# Patient Record
Sex: Female | Born: 1950 | Race: White | Hispanic: No | Marital: Married | Smoking: Never smoker
Health system: Southern US, Community
[De-identification: ages and names within clinical notes are randomized; demographics above are authoritative.]

## PROBLEM LIST (undated history)

## (undated) DIAGNOSIS — F419 Anxiety disorder, unspecified: Secondary | ICD-10-CM

## (undated) DIAGNOSIS — F32A Depression, unspecified: Secondary | ICD-10-CM

## (undated) DIAGNOSIS — F329 Major depressive disorder, single episode, unspecified: Secondary | ICD-10-CM

## (undated) DIAGNOSIS — J3089 Other allergic rhinitis: Secondary | ICD-10-CM

## (undated) DIAGNOSIS — E119 Type 2 diabetes mellitus without complications: Secondary | ICD-10-CM

## (undated) DIAGNOSIS — N840 Polyp of corpus uteri: Secondary | ICD-10-CM

## (undated) DIAGNOSIS — I499 Cardiac arrhythmia, unspecified: Secondary | ICD-10-CM

## (undated) DIAGNOSIS — E785 Hyperlipidemia, unspecified: Secondary | ICD-10-CM

## (undated) DIAGNOSIS — K219 Gastro-esophageal reflux disease without esophagitis: Secondary | ICD-10-CM

## (undated) DIAGNOSIS — I10 Essential (primary) hypertension: Secondary | ICD-10-CM

## (undated) DIAGNOSIS — E1169 Type 2 diabetes mellitus with other specified complication: Secondary | ICD-10-CM

## (undated) HISTORY — DX: Hyperlipidemia, unspecified: E78.5

## (undated) HISTORY — PX: FOOT SURGERY: SHX648

## (undated) HISTORY — DX: Hyperlipidemia, unspecified: E11.69

## (undated) HISTORY — DX: Type 2 diabetes mellitus without complications: E11.9

---
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
## 2000-03-10 ENCOUNTER — Emergency Department (HOSPITAL_COMMUNITY): Admission: EM | Admit: 2000-03-10 | Discharge: 2000-03-10 | Payer: Self-pay

## 2002-08-09 ENCOUNTER — Encounter: Payer: Self-pay | Admitting: Family Medicine

## 2002-08-09 ENCOUNTER — Ambulatory Visit (HOSPITAL_COMMUNITY): Admission: RE | Admit: 2002-08-09 | Discharge: 2002-08-09 | Payer: Self-pay | Admitting: Family Medicine

## 2004-05-14 ENCOUNTER — Ambulatory Visit (HOSPITAL_COMMUNITY): Admission: RE | Admit: 2004-05-14 | Discharge: 2004-05-14 | Payer: Self-pay | Admitting: Family Medicine

## 2005-08-03 ENCOUNTER — Other Ambulatory Visit: Admission: RE | Admit: 2005-08-03 | Discharge: 2005-08-03 | Payer: Self-pay | Admitting: Family Medicine

## 2005-11-22 ENCOUNTER — Emergency Department (HOSPITAL_COMMUNITY): Admission: EM | Admit: 2005-11-22 | Discharge: 2005-11-22 | Payer: Self-pay | Admitting: Emergency Medicine

## 2006-03-06 ENCOUNTER — Emergency Department (HOSPITAL_COMMUNITY): Admission: EM | Admit: 2006-03-06 | Discharge: 2006-03-06 | Payer: Self-pay | Admitting: *Deleted

## 2006-03-10 ENCOUNTER — Ambulatory Visit (HOSPITAL_COMMUNITY): Admission: RE | Admit: 2006-03-10 | Discharge: 2006-03-10 | Payer: Self-pay | Admitting: Orthopaedic Surgery

## 2007-02-02 ENCOUNTER — Other Ambulatory Visit: Admission: RE | Admit: 2007-02-02 | Discharge: 2007-02-02 | Payer: Self-pay | Admitting: Family Medicine

## 2008-04-09 ENCOUNTER — Other Ambulatory Visit: Admission: RE | Admit: 2008-04-09 | Discharge: 2008-04-09 | Payer: Self-pay | Admitting: Family Medicine

## 2008-04-27 ENCOUNTER — Emergency Department (HOSPITAL_COMMUNITY): Admission: EM | Admit: 2008-04-27 | Discharge: 2008-04-27 | Payer: Self-pay | Admitting: Family Medicine

## 2009-09-12 ENCOUNTER — Emergency Department (HOSPITAL_COMMUNITY): Admission: EM | Admit: 2009-09-12 | Discharge: 2009-09-12 | Payer: Self-pay | Admitting: Emergency Medicine

## 2009-11-11 ENCOUNTER — Emergency Department (HOSPITAL_COMMUNITY): Admission: EM | Admit: 2009-11-11 | Discharge: 2009-11-11 | Payer: Self-pay | Admitting: Family Medicine

## 2009-12-30 ENCOUNTER — Inpatient Hospital Stay (HOSPITAL_COMMUNITY): Admission: AD | Admit: 2009-12-30 | Discharge: 2009-12-30 | Payer: Self-pay | Admitting: Obstetrics & Gynecology

## 2010-01-14 ENCOUNTER — Ambulatory Visit: Payer: Self-pay | Admitting: Obstetrics and Gynecology

## 2010-02-26 ENCOUNTER — Ambulatory Visit: Payer: Self-pay | Admitting: Obstetrics and Gynecology

## 2010-02-26 ENCOUNTER — Other Ambulatory Visit: Admission: RE | Admit: 2010-02-26 | Discharge: 2010-02-26 | Payer: Self-pay | Admitting: Obstetrics and Gynecology

## 2010-02-27 ENCOUNTER — Encounter: Payer: Self-pay | Admitting: Obstetrics and Gynecology

## 2010-02-27 LAB — CONVERTED CEMR LAB
Clue Cells Wet Prep HPF POC: NONE SEEN
Trich, Wet Prep: NONE SEEN
Yeast Wet Prep HPF POC: NONE SEEN

## 2011-02-22 LAB — URINALYSIS, ROUTINE W REFLEX MICROSCOPIC
Glucose, UA: NEGATIVE mg/dL
Hgb urine dipstick: NEGATIVE
Nitrite: NEGATIVE
Protein, ur: NEGATIVE mg/dL
Urobilinogen, UA: 0.2 mg/dL (ref 0.0–1.0)
pH: 5 (ref 5.0–8.0)

## 2011-02-22 LAB — WET PREP, GENITAL
Clue Cells Wet Prep HPF POC: NONE SEEN
Trich, Wet Prep: NONE SEEN
Yeast Wet Prep HPF POC: NONE SEEN

## 2011-02-22 LAB — COMPREHENSIVE METABOLIC PANEL
Alkaline Phosphatase: 93 U/L (ref 39–117)
CO2: 28 mEq/L (ref 19–32)
Total Bilirubin: 0.6 mg/dL (ref 0.3–1.2)
Total Protein: 7.3 g/dL (ref 6.0–8.3)

## 2011-02-22 LAB — URINE MICROSCOPIC-ADD ON

## 2011-02-22 LAB — CBC
Hemoglobin: 13 g/dL (ref 12.0–15.0)
MCV: 84.5 fL (ref 78.0–100.0)

## 2011-03-10 ENCOUNTER — Emergency Department (HOSPITAL_BASED_OUTPATIENT_CLINIC_OR_DEPARTMENT_OTHER)
Admission: EM | Admit: 2011-03-10 | Discharge: 2011-03-10 | Disposition: A | Discharge status: Home / Self Care | Payer: Self-pay | Attending: Emergency Medicine | Admitting: Emergency Medicine

## 2011-03-10 ENCOUNTER — Emergency Department (INDEPENDENT_AMBULATORY_CARE_PROVIDER_SITE_OTHER): Payer: Self-pay

## 2011-03-10 DIAGNOSIS — I10 Essential (primary) hypertension: Secondary | ICD-10-CM | POA: Insufficient documentation

## 2011-03-10 DIAGNOSIS — Z79899 Other long term (current) drug therapy: Secondary | ICD-10-CM | POA: Insufficient documentation

## 2011-03-10 DIAGNOSIS — F0781 Postconcussional syndrome: Secondary | ICD-10-CM | POA: Insufficient documentation

## 2011-03-10 DIAGNOSIS — Y92009 Unspecified place in unspecified non-institutional (private) residence as the place of occurrence of the external cause: Secondary | ICD-10-CM | POA: Insufficient documentation

## 2011-03-10 DIAGNOSIS — R42 Dizziness and giddiness: Secondary | ICD-10-CM

## 2011-03-10 DIAGNOSIS — R110 Nausea: Secondary | ICD-10-CM

## 2011-03-10 DIAGNOSIS — W010XXA Fall on same level from slipping, tripping and stumbling without subsequent striking against object, initial encounter: Secondary | ICD-10-CM

## 2011-03-10 DIAGNOSIS — J45909 Unspecified asthma, uncomplicated: Secondary | ICD-10-CM | POA: Insufficient documentation

## 2011-03-10 DIAGNOSIS — M25569 Pain in unspecified knee: Secondary | ICD-10-CM | POA: Insufficient documentation

## 2011-03-11 LAB — CBC
HCT: 38.7 % (ref 36.0–46.0)
Hemoglobin: 13 g/dL (ref 12.0–15.0)
MCV: 84.9 fL (ref 78.0–100.0)

## 2011-03-11 LAB — BASIC METABOLIC PANEL
BUN: 18 mg/dL (ref 6–23)
Chloride: 106 mEq/L (ref 96–112)
Creatinine, Ser: 0.83 mg/dL (ref 0.4–1.2)
GFR calc Af Amer: >60 mL/min (ref >60)
GFR calc non Af Amer: >60 mL/min (ref >60)
Glucose, Bld: 96 mg/dL (ref 70–99)

## 2011-03-11 LAB — DIFFERENTIAL
Basophils Relative: 1 % (ref 0–1)
Lymphocytes Relative: 19 % (ref 12–46)
Neutro Abs: 5.4 10*3/uL (ref 1.7–7.7)

## 2011-03-11 LAB — URINE MICROSCOPIC-ADD ON

## 2011-03-11 LAB — URINALYSIS, ROUTINE W REFLEX MICROSCOPIC
Bilirubin Urine: NEGATIVE
Glucose, UA: NEGATIVE mg/dL
Specific Gravity, Urine: 1.02 (ref 1.005–1.030)
pH: 6 (ref 5.0–8.0)

## 2011-03-11 LAB — POCT CARDIAC MARKERS
CKMB, poc: <1 ng/mL — ABNORMAL LOW (ref 1.0–8.0)
Troponin i, poc: <0.05 ng/mL (ref 0.00–0.09)

## 2011-04-23 NOTE — Op Note (Signed)
NAME:  Suzanne Patterson, Suzanne Patterson           ACCOUNT NO.:  [id number]   MEDICAL RECORD NO.:  [id number]          PATIENT TYPE:  AMB   LOCATION:  SDS                          FACILITY:  [facility]   PHYSICIAN:  Claude Manges. Whitfield, M.D.DATE OF BIRTH:  1951/03/30   DATE OF PROCEDURE:  03/10/2006  DATE OF DISCHARGE:  03/10/2006                                 OPERATIVE REPORT   PREOPERATIVE DIAGNOSIS:  Closed fracture dislocation of right ankle  involving displaced right distal fibular fracture, posterior tibial plafond  fracture, and anterior ankle dislocation, status post initial reduction of  diastasis and ankle dislocation.   POSTOPERATIVE DIAGNOSIS:  Closed fracture dislocation of right ankle  involving displaced right distal fibular fracture, posterior tibial plafond  fracture, and anterior ankle dislocation, status post initial reduction of  diastasis and ankle dislocation.   PROCEDURE:  Open reduction and internal fixation of right distal fibula,  with fixation of tibial-fibular diastasis.   SURGEON:  Claude Manges. Cleophas Dunker, M.D.   ASSISTANT:  Arlys John D. Petrarca, P.A.-C.   ANESTHESIA:  General orotracheal.   COMPLICATIONS:  None.   HISTORY:  A 60 year old female who fell this past Saturday night, i.e., 4  days ago sustaining an anterior dislocation of the ankle associated with  diastasis of the distal tibia and fibula, a posterior tibia plafond  fracture, and distal fibular fracture associated with a rupture of the  deltoid ligament. Initial treatment in the emergency room involved reduction  of the ankle and diastasis and application of posterior splint.  She was  discharged on medicine and crutches and was followed the office without  problems with the skin or complications.  She is now to have formal fixation  of the distal fibula and fixation of the diastasis.   PROCEDURE:  With the patient comfortable on the operating table and under  general orotracheal anesthesia, a tourniquet was  applied to the right lower  extremity.  On the skin, there was an abrasion along the anteromedial aspect  of the ankle and a blister just above the medial malleolus which was intact.  Otherwise, the skin was intact.  There was no evidence of infection.  The  leg was then prepped with Betadine scrub and then DuraPrep from the tips of  the toes.  Sterile draping was performed.   With  the extremity still elevated, it was Esmarch exsanguinated, with the  proximal tourniquet at 350 mmHg.   A longitudinal incision was made along the distal fibula, perhaps 3 to 3-1/2  inch in length.  Via sharp dissection, the incision was carried down to the  subcutaneous tissue.  Hematoma was debrided. Via blunt dissection, the soft  tissue was then elevated from the fibula. Small veins were left intact.  There was comminution at the fibular fracture. Under direct visualization,  the fracture was reduced and then maintained with a bone clamp. The Acumed  plate and screw system was utilized in addition to the Acumed diastasis  button.   The seven-hole pre-bent titanium plate was then placed over the distal  fibula. It did not quite conform to the shape of the fibula, so it  was sent  bent so that it would.  There were three holes proximal and four distal to  the fracture, with the tip of the plate close to the tip of the fibula. The  plate was maintained in position with a bone clamp.  The two more proximal  screws were drilled, measured, and filled with self-tapping cortical screws.  The two distal screw holes were drilled, measured, and filled with self-  tapping 4-0 cancellus screws.  We checked position at this point, and we had  an excellent position of the plate, and the screws were well within the  fibula distally.  The third most proximal hole was then drilled, measured,  and filled with a self-tapping cortical screw. The the fourth hole was  directly over the fracture site and left alone.  The third  hole proximal to  the very tip was used for the diastasis fixation.  A 3.5 mm drill hole was  then made and then exited out along the medial anterior tibia.  The Acumed  button was threaded through the hole and across the fibula and the tibia  with a blunt needle tip and then placed through the disk. A small stab wound  was made. Soft tissue was elevated off the tibial periosteum, and the button  was then secured to the tibia.  With the foot in dorsiflexion and  maintaining compression between the tibia and fibula, the button was  tightened.  Image intensification revealed reduction of the diastasis.  Several loops were then placed in the suture, and then they were cut.  We  had excellent fixation of the fibula and the diastasis.  The wound was then  copiously irrigated with saline solution.  The soft tissue was closed in  several layers of 0 and 2-0 Vicryl.  The skin closed with a running 3-0  subcuticular Prolene with Steri-Strips over Benzoin.  Sterile bulky dressing  was applied followed by posterior splints.   PLAN:  Percocet for pain; aspirin daily.  Crutches.  Office 1 week.      Claude Manges. Cleophas Dunker, M.D.  Electronically Signed     PWW/MEDQ  D:  03/10/2006  T:  03/11/2006  Job:  [id number]

## 2011-06-24 ENCOUNTER — Encounter: Payer: Self-pay | Admitting: *Deleted

## 2011-06-24 ENCOUNTER — Emergency Department (HOSPITAL_BASED_OUTPATIENT_CLINIC_OR_DEPARTMENT_OTHER)
Admission: EM | Admit: 2011-06-24 | Discharge: 2011-06-25 | Disposition: A | Discharge status: Home / Self Care | Payer: Self-pay | Attending: Emergency Medicine | Admitting: Emergency Medicine

## 2011-06-24 DIAGNOSIS — K219 Gastro-esophageal reflux disease without esophagitis: Secondary | ICD-10-CM | POA: Insufficient documentation

## 2011-06-24 DIAGNOSIS — E860 Dehydration: Secondary | ICD-10-CM | POA: Insufficient documentation

## 2011-06-24 DIAGNOSIS — K5289 Other specified noninfective gastroenteritis and colitis: Secondary | ICD-10-CM | POA: Insufficient documentation

## 2011-06-24 DIAGNOSIS — R112 Nausea with vomiting, unspecified: Secondary | ICD-10-CM | POA: Insufficient documentation

## 2011-06-24 DIAGNOSIS — R197 Diarrhea, unspecified: Secondary | ICD-10-CM | POA: Insufficient documentation

## 2011-06-24 DIAGNOSIS — I10 Essential (primary) hypertension: Secondary | ICD-10-CM | POA: Insufficient documentation

## 2011-06-24 HISTORY — DX: Cardiac arrhythmia, unspecified: I49.9

## 2011-06-24 HISTORY — DX: Gastro-esophageal reflux disease without esophagitis: K21.9

## 2011-06-24 HISTORY — DX: Essential (primary) hypertension: I10

## 2011-06-24 NOTE — ED Notes (Signed)
Pt presents to ED today with c/o flu like sx since this am.  Pt reports seveal members of household have also been sick.  Pt called PMD and was told to come to ER

## 2011-06-25 LAB — COMPREHENSIVE METABOLIC PANEL WITH GFR
ALT: 21 U/L (ref 0–35)
AST: 22 U/L (ref 0–37)
Albumin: 4 g/dL (ref 3.5–5.2)
Alkaline Phosphatase: 111 U/L (ref 39–117)
BUN: 21 mg/dL (ref 6–23)
CO2: 25 meq/L (ref 19–32)
Calcium: 9.1 mg/dL (ref 8.4–10.5)
Chloride: 100 meq/L (ref 96–112)
Creatinine, Ser: 0.7 mg/dL (ref 0.50–1.10)
GFR calc Af Amer: >60 mL/min
GFR calc non Af Amer: >60 mL/min
Glucose, Bld: 138 mg/dL — ABNORMAL HIGH (ref 70–99)
Potassium: 3.8 meq/L (ref 3.5–5.1)
Sodium: 139 meq/L (ref 135–145)
Total Bilirubin: 0.3 mg/dL (ref 0.3–1.2)
Total Protein: 7.9 g/dL (ref 6.0–8.3)

## 2011-06-25 LAB — CBC
Hemoglobin: 13.8 g/dL (ref 12.0–15.0)
MCH: 28 pg (ref 26.0–34.0)
MCV: 83.5 fL (ref 78.0–100.0)
Platelets: 290 10*3/uL (ref 150–400)
RBC: 4.92 MIL/uL (ref 3.87–5.11)
WBC: 10.8 10*3/uL — ABNORMAL HIGH (ref 4.0–10.5)

## 2011-06-25 LAB — URINALYSIS, ROUTINE W REFLEX MICROSCOPIC
Bilirubin Urine: NEGATIVE
Glucose, UA: NEGATIVE mg/dL
Hgb urine dipstick: NEGATIVE
Ketones, ur: NEGATIVE mg/dL
Leukocytes, UA: NEGATIVE
Nitrite: NEGATIVE
Protein, ur: NEGATIVE mg/dL
Specific Gravity, Urine: 1.017 (ref 1.005–1.030)
Urobilinogen, UA: 0.2 mg/dL (ref 0.0–1.0)
pH: 6.5 (ref 5.0–8.0)

## 2011-06-25 MED ORDER — SODIUM CHLORIDE 0.9 % IV SOLN
Freq: Once | INTRAVENOUS | Status: AC
  Administered 2011-06-25: 01:00:00 via INTRAVENOUS

## 2011-06-25 MED ORDER — LOPERAMIDE HCL 2 MG PO CAPS: 2.0000 mg | ORAL_CAPSULE | Freq: Four times a day (QID) | ORAL | Status: AC | PRN

## 2011-06-25 MED ORDER — ONDANSETRON HCL 4 MG PO TABS: 4.0000 mg | ORAL_TABLET | Freq: Four times a day (QID) | ORAL | Status: AC

## 2011-06-25 MED ORDER — SODIUM CHLORIDE 0.9 % IV SOLN
8.0000 mg | Freq: Once | INTRAVENOUS | Status: AC
  Administered 2011-06-25: 8 mg via INTRAVENOUS
  Filled 2011-06-25: qty 4

## 2011-06-25 MED ORDER — SODIUM CHLORIDE 0.9 % IV SOLN
Freq: Once | INTRAVENOUS | Status: AC
  Administered 2011-06-25: via INTRAVENOUS

## 2011-06-25 NOTE — ED Notes (Signed)
Pt presents to ED today with flu like sx since this am.  Pt states unable to tolerate fluids and was advised by PMD to come to ER

## 2011-06-25 NOTE — ED Notes (Signed)
Pt given ice chips per request

## 2011-06-25 NOTE — ED Provider Notes (Signed)
History     Chief Complaint  Patient presents with  . Nausea  . Emesis  . Diarrhea   HPI Comments: Pt has had several family members with the same illness recently.  Patient is a 60 y.o. female presenting with vomiting and diarrhea. The history is provided by the patient.  Emesis  The current episode started 12 to 24 hours ago. The problem occurs more than 10 times per day. The problem has been gradually worsening. Associated symptoms include diarrhea. Pertinent negatives include no abdominal pain.  Diarrhea The primary symptoms include vomiting and diarrhea. Primary symptoms do not include abdominal pain, melena, hematochezia or dysuria.  The diarrhea began today. The diarrhea is watery. The diarrhea occurs more than 10 times per day.  The illness does not include anorexia. Associated medical issues do not include inflammatory bowel disease or bowel resection.    Past Medical History  Diagnosis Date  . Hypertension   . Dysrhythmia   . GERD (gastroesophageal reflux disease)     History reviewed. No pertinent past surgical history.  No family history on file.  History  Substance Use Topics  . Smoking status: Never Smoker   . Smokeless tobacco: Never Used  . Alcohol Use: No    OB History    Grav Para Term Preterm Abortions TAB SAB Ect Mult Living                  Review of Systems  Constitutional: Positive for appetite change.  Respiratory: Negative for chest tightness.   Gastrointestinal: Positive for vomiting and diarrhea. Negative for abdominal pain, blood in stool, melena, hematochezia and anorexia.  Genitourinary: Negative for dysuria.  All other systems reviewed and are negative.    Physical Exam  BP 134/76  Pulse 88  Temp(Src) 98.5 F (36.9 C) (Oral)  Resp 20  SpO2 98%  Physical Exam  Constitutional: She appears well-developed and well-nourished. No distress.  HENT:  Head: Normocephalic and atraumatic.  Right Ear: External ear normal.  Left Ear:  External ear normal.  Mouth/Throat: No oropharyngeal exudate.       Mucous membranes are dry  Eyes: Conjunctivae are normal. Right eye exhibits no discharge. Left eye exhibits no discharge. No scleral icterus.  Neck: Neck supple. No tracheal deviation present.  Cardiovascular: Normal rate, regular rhythm and intact distal pulses.   Pulmonary/Chest: Effort normal and breath sounds normal. No stridor. No respiratory distress. She has no wheezes. She has no rales.  Abdominal: Soft. Bowel sounds are normal. She exhibits no distension and no mass. There is no tenderness. There is no rebound and no guarding.  Musculoskeletal: She exhibits no edema and no tenderness.  Neurological: She is alert. She has normal strength. No sensory deficit. Cranial nerve deficit:  no gross defecits noted. She exhibits normal muscle tone. She displays no seizure activity. Coordination normal.  Skin: Skin is warm and dry. No rash noted.  Psychiatric: She has a normal mood and affect.    ED Course  Procedures Labs Reviewed  CBC - Abnormal; Notable for the following:    WBC 10.8 (*)    All other components within normal limits  COMPREHENSIVE METABOLIC PANEL - Abnormal; Notable for the following:    Glucose, Bld 138 (*)    All other components within normal limits  URINALYSIS, ROUTINE W REFLEX MICROSCOPIC    MDM Labs and x-rays have been reviewed. Patient was given IV fluids and anti-emetics. She is feeling much better at this time. Patient would like  to go home but will first try some ice chips oral to make sure she can tolerate that. Suspect this is viral gastroenteritis. She's had ill contacts in the family     Celene Kras, MD 06/25/11 [phone]

## 2011-06-25 NOTE — ED Notes (Signed)
Family at bedside. 

## 2011-10-12 ENCOUNTER — Other Ambulatory Visit (HOSPITAL_COMMUNITY)
Admission: RE | Admit: 2011-10-12 | Discharge: 2011-10-12 | Disposition: A | Discharge status: Home / Self Care | Payer: Self-pay | Source: Ambulatory Visit | Attending: Family Medicine | Admitting: Family Medicine

## 2011-10-12 ENCOUNTER — Other Ambulatory Visit: Payer: Self-pay | Admitting: Family Medicine

## 2011-10-12 DIAGNOSIS — Z0000 Encounter for general adult medical examination without abnormal findings: Secondary | ICD-10-CM | POA: Insufficient documentation

## 2015-10-28 DIAGNOSIS — E785 Hyperlipidemia, unspecified: Secondary | ICD-10-CM | POA: Diagnosis not present

## 2015-10-28 DIAGNOSIS — Z13820 Encounter for screening for osteoporosis: Secondary | ICD-10-CM | POA: Diagnosis not present

## 2015-10-28 DIAGNOSIS — N949 Unspecified condition associated with female genital organs and menstrual cycle: Secondary | ICD-10-CM | POA: Diagnosis not present

## 2015-10-28 DIAGNOSIS — I10 Essential (primary) hypertension: Secondary | ICD-10-CM | POA: Diagnosis not present

## 2015-10-28 DIAGNOSIS — F419 Anxiety disorder, unspecified: Secondary | ICD-10-CM | POA: Diagnosis not present

## 2015-10-28 DIAGNOSIS — Z1211 Encounter for screening for malignant neoplasm of colon: Secondary | ICD-10-CM | POA: Diagnosis not present

## 2015-10-28 DIAGNOSIS — Z23 Encounter for immunization: Secondary | ICD-10-CM | POA: Diagnosis not present

## 2015-10-28 DIAGNOSIS — Z0000 Encounter for general adult medical examination without abnormal findings: Secondary | ICD-10-CM | POA: Diagnosis not present

## 2015-10-28 DIAGNOSIS — Z1389 Encounter for screening for other disorder: Secondary | ICD-10-CM | POA: Diagnosis not present

## 2015-10-28 DIAGNOSIS — J45909 Unspecified asthma, uncomplicated: Secondary | ICD-10-CM | POA: Diagnosis not present

## 2015-10-28 DIAGNOSIS — E7800 Pure hypercholesterolemia, unspecified: Secondary | ICD-10-CM | POA: Diagnosis not present

## 2016-01-15 DIAGNOSIS — R928 Other abnormal and inconclusive findings on diagnostic imaging of breast: Secondary | ICD-10-CM | POA: Diagnosis not present

## 2016-03-24 DIAGNOSIS — Z85828 Personal history of other malignant neoplasm of skin: Secondary | ICD-10-CM | POA: Diagnosis not present

## 2016-03-24 DIAGNOSIS — X32XXXA Exposure to sunlight, initial encounter: Secondary | ICD-10-CM | POA: Diagnosis not present

## 2016-03-24 DIAGNOSIS — L570 Actinic keratosis: Secondary | ICD-10-CM | POA: Diagnosis not present

## 2016-03-24 DIAGNOSIS — Z08 Encounter for follow-up examination after completed treatment for malignant neoplasm: Secondary | ICD-10-CM | POA: Diagnosis not present

## 2016-04-23 DIAGNOSIS — M25571 Pain in right ankle and joints of right foot: Secondary | ICD-10-CM | POA: Diagnosis not present

## 2016-04-23 DIAGNOSIS — M19071 Primary osteoarthritis, right ankle and foot: Secondary | ICD-10-CM | POA: Diagnosis not present

## 2016-04-26 DIAGNOSIS — I10 Essential (primary) hypertension: Secondary | ICD-10-CM | POA: Diagnosis not present

## 2016-04-26 DIAGNOSIS — R7309 Other abnormal glucose: Secondary | ICD-10-CM | POA: Diagnosis not present

## 2016-07-25 DIAGNOSIS — N760 Acute vaginitis: Secondary | ICD-10-CM | POA: Diagnosis not present

## 2016-07-25 DIAGNOSIS — N898 Other specified noninflammatory disorders of vagina: Secondary | ICD-10-CM | POA: Diagnosis not present

## 2016-08-07 DIAGNOSIS — J069 Acute upper respiratory infection, unspecified: Secondary | ICD-10-CM | POA: Diagnosis not present

## 2016-08-30 DIAGNOSIS — Z23 Encounter for immunization: Secondary | ICD-10-CM | POA: Diagnosis not present

## 2016-08-30 DIAGNOSIS — F419 Anxiety disorder, unspecified: Secondary | ICD-10-CM | POA: Diagnosis not present

## 2016-08-30 DIAGNOSIS — I10 Essential (primary) hypertension: Secondary | ICD-10-CM | POA: Diagnosis not present

## 2016-08-30 DIAGNOSIS — R7301 Impaired fasting glucose: Secondary | ICD-10-CM | POA: Diagnosis not present

## 2016-10-13 DIAGNOSIS — N898 Other specified noninflammatory disorders of vagina: Secondary | ICD-10-CM | POA: Diagnosis not present

## 2016-11-02 DIAGNOSIS — I10 Essential (primary) hypertension: Secondary | ICD-10-CM | POA: Diagnosis not present

## 2016-11-02 DIAGNOSIS — Z23 Encounter for immunization: Secondary | ICD-10-CM | POA: Diagnosis not present

## 2016-11-02 DIAGNOSIS — E119 Type 2 diabetes mellitus without complications: Secondary | ICD-10-CM | POA: Diagnosis not present

## 2016-11-02 DIAGNOSIS — Z13820 Encounter for screening for osteoporosis: Secondary | ICD-10-CM | POA: Diagnosis not present

## 2016-11-02 DIAGNOSIS — Z0000 Encounter for general adult medical examination without abnormal findings: Secondary | ICD-10-CM | POA: Diagnosis not present

## 2016-11-02 DIAGNOSIS — Z1389 Encounter for screening for other disorder: Secondary | ICD-10-CM | POA: Diagnosis not present

## 2016-11-02 DIAGNOSIS — Z1211 Encounter for screening for malignant neoplasm of colon: Secondary | ICD-10-CM | POA: Diagnosis not present

## 2016-12-25 ENCOUNTER — Emergency Department (HOSPITAL_BASED_OUTPATIENT_CLINIC_OR_DEPARTMENT_OTHER): Payer: Medicare HMO

## 2016-12-25 ENCOUNTER — Encounter (HOSPITAL_BASED_OUTPATIENT_CLINIC_OR_DEPARTMENT_OTHER): Payer: Self-pay | Admitting: Emergency Medicine

## 2016-12-25 ENCOUNTER — Emergency Department (HOSPITAL_BASED_OUTPATIENT_CLINIC_OR_DEPARTMENT_OTHER)
Admission: EM | Admit: 2016-12-25 | Discharge: 2016-12-25 | Disposition: A | Discharge status: Home / Self Care | Payer: Medicare HMO | Attending: Emergency Medicine | Admitting: Emergency Medicine

## 2016-12-25 DIAGNOSIS — Z7982 Long term (current) use of aspirin: Secondary | ICD-10-CM | POA: Insufficient documentation

## 2016-12-25 DIAGNOSIS — Z79899 Other long term (current) drug therapy: Secondary | ICD-10-CM | POA: Diagnosis not present

## 2016-12-25 DIAGNOSIS — R072 Precordial pain: Secondary | ICD-10-CM | POA: Diagnosis not present

## 2016-12-25 DIAGNOSIS — M549 Dorsalgia, unspecified: Secondary | ICD-10-CM | POA: Insufficient documentation

## 2016-12-25 DIAGNOSIS — R0602 Shortness of breath: Secondary | ICD-10-CM | POA: Insufficient documentation

## 2016-12-25 DIAGNOSIS — I10 Essential (primary) hypertension: Secondary | ICD-10-CM | POA: Diagnosis not present

## 2016-12-25 DIAGNOSIS — R0789 Other chest pain: Secondary | ICD-10-CM | POA: Diagnosis not present

## 2016-12-25 DIAGNOSIS — R079 Chest pain, unspecified: Secondary | ICD-10-CM

## 2016-12-25 LAB — URINALYSIS, ROUTINE W REFLEX MICROSCOPIC
Bilirubin Urine: NEGATIVE
GLUCOSE, UA: NEGATIVE mg/dL
HGB URINE DIPSTICK: NEGATIVE
Ketones, ur: NEGATIVE mg/dL
Leukocytes, UA: NEGATIVE
Nitrite: NEGATIVE
Protein, ur: NEGATIVE mg/dL
Specific Gravity, Urine: 1.015 (ref 1.005–1.030)
pH: 7.5 (ref 5.0–8.0)

## 2016-12-25 LAB — COMPREHENSIVE METABOLIC PANEL
ALT: 27 U/L (ref 14–54)
AST: 26 U/L (ref 15–41)
Albumin: 4.2 g/dL (ref 3.5–5.0)
Alkaline Phosphatase: 82 U/L (ref 38–126)
Anion gap: 6 (ref 5–15)
BILIRUBIN TOTAL: 0.4 mg/dL (ref 0.3–1.2)
BUN: 19 mg/dL (ref 6–20)
CHLORIDE: 105 mmol/L (ref 101–111)
CO2: 28 mmol/L (ref 22–32)
CREATININE: 0.76 mg/dL (ref 0.44–1.00)
Calcium: 8.7 mg/dL — ABNORMAL LOW (ref 8.9–10.3)
GFR calc Af Amer: >60 mL/min (ref >60)
GFR calc non Af Amer: >60 mL/min (ref >60)
GLUCOSE: 142 mg/dL — AB (ref 65–99)
Potassium: 3.6 mmol/L (ref 3.5–5.1)
Sodium: 139 mmol/L (ref 135–145)
Total Protein: 7.7 g/dL (ref 6.5–8.1)

## 2016-12-25 LAB — TROPONIN I
Troponin I: <0.03 ng/mL (ref <0.03)
Troponin I: <0.03 ng/mL (ref <0.03)

## 2016-12-25 LAB — CBC
HEMATOCRIT: 39.9 % (ref 36.0–46.0)
Hemoglobin: 12.5 g/dL (ref 12.0–15.0)
MCH: 27.8 pg (ref 26.0–34.0)
MCHC: 31.3 g/dL (ref 30.0–36.0)
MCV: 88.7 fL (ref 78.0–100.0)
PLATELETS: 305 10*3/uL (ref 150–400)
RBC: 4.5 MIL/uL (ref 3.87–5.11)
RDW: 14.7 % (ref 11.5–15.5)
WBC: 7.9 10*3/uL (ref 4.0–10.5)

## 2016-12-25 LAB — D-DIMER, QUANTITATIVE: D-Dimer, Quant: <0.27 ug/mL-FEU (ref 0.00–0.50)

## 2016-12-25 MED ORDER — ASPIRIN 81 MG PO CHEW
243.0000 mg | CHEWABLE_TABLET | Freq: Once | ORAL | Status: AC
  Administered 2016-12-25: 243 mg via ORAL
  Filled 2016-12-25: qty 3

## 2016-12-25 NOTE — ED Triage Notes (Signed)
Pt states she was having breakfast and starte to feel a sharp pain in between her shoulder blades and the pain then radiated through to her sternal chest.  Pt reports some mild SOB and dizzy also.

## 2016-12-25 NOTE — Discharge Instructions (Signed)
All of your blood work and imaging has been normal in the ED. Please follow up with your primary care doctor. May continue to take tylenol and ibuprofen for pain. Return to the ED for chest pain becomes exertional, he developed worsening shortness of breath, fevers or for any other reason.

## 2016-12-25 NOTE — ED Provider Notes (Signed)
[facility] Provider Note   CSN: [id number] Arrival date & time: 12/25/16  [id number]     History   Chief Complaint Chief Complaint  Patient presents with  . Chest Pain    HPI Suzanne Patterson is a 66 y.o. female.  66 year old Caucasian female past medical history significant for hypertension and GERD presents to the ED today with complaint of chest pain. Patient states that approximately 9 AM this morning she developed pain in her back between her shoulder blades that radiated to her chest. Patient describes the pain as a sharp ache that has now decreased to a dull ache. Patient states that the pain has been constant but has decreased. She describes a substernal chest pain. The pain was not exertional. It was not pleuritic in nature. Patient does report mild shortness of breath and nausea with the episode. She denies any diaphoresis. The pain did not radiate. She took 2 baby aspirin at home that decreased her pain. Patient also reports mild dizziness with episode that has since resolved. Patient also states that she ate breakfast and had pain with swallowing her food. Patient states that she does have acid reflux and endorses reflux yesterday after eating lasagna. Denies any reflux at this time. Patient does not have any cardiac history. She does have a history of hypertension. Well-controlled on blood pressure medications. Patient denies any hormone therapy, recent hospitalizations or surgeries, prolonged immobilizations, history of DVT, tobacco use. Patient denies any shortness breath at this time. Endorses only mild backache. She denies any fever, chills, headache, numbness,  Patient does state that this can likely be due to anxiety. She has lot of stressors going on in her life currently.      Past Medical History:  Diagnosis Date  . Dysrhythmia   . GERD (gastroesophageal reflux disease)   . Hypertension     There are no active problems to display for this  patient.   Past Surgical History:  Procedure Laterality Date  . FOOT SURGERY Right     OB History    No data available       Home Medications    Prior to Admission medications   Medication Sig Start Date End Date Taking? Authorizing Provider  amLODipine-benazepril (LOTREL) 10-20 MG per capsule Take 1 capsule by mouth daily.     Yes Historical Provider, MD  aspirin 81 MG chewable tablet Chew 81 mg by mouth daily.   Yes Historical Provider, MD  valsartan (DIOVAN) 320 MG tablet Take 320 mg by mouth daily.   Yes Historical Provider, MD  benazepril (LOTENSIN) 20 MG tablet Take 20 mg by mouth daily.      Historical Provider, MD  cetirizine (ZYRTEC) 10 MG tablet Take 10 mg by mouth daily.      Historical Provider, MD  esomeprazole (NEXIUM) 40 MG capsule Take 40 mg by mouth daily before breakfast.      Historical Provider, MD  metoprolol (TOPROL-XL) 50 MG 24 hr tablet Take 50 mg by mouth daily.      Historical Provider, MD    Family History No family history on file.  Social History Social History  Substance Use Topics  . Smoking status: Never Smoker  . Smokeless tobacco: Never Used  . Alcohol use No     Allergies   Codeine and Hydrocodone   Review of Systems Review of Systems  Constitutional: Negative for chills and fever.  HENT: Negative for congestion.   Eyes: Negative for visual disturbance.  Respiratory: Positive  for shortness of breath. Negative for cough and wheezing.   Cardiovascular: Positive for chest pain. Negative for palpitations and leg swelling.  Gastrointestinal: Negative for abdominal pain, diarrhea, nausea and vomiting.  Genitourinary: Negative for dysuria, frequency, hematuria and urgency.  Musculoskeletal: Positive for back pain. Negative for neck pain and neck stiffness.  Skin: Negative.   Neurological: Negative for dizziness, syncope, weakness, light-headedness, numbness and headaches.  All other systems reviewed and are negative.    Physical  Exam Updated Vital Signs BP 134/78   Pulse 73   Temp 98.5 F (36.9 C) (Oral)   Resp 17   Ht 5\' 5"  (1.651 m)   Wt 113.4 kg   SpO2 98%   BMI 41.60 kg/m   Physical Exam  Constitutional: She appears well-developed and well-nourished. No distress.  Patient is laughing and joking in the room and appears to be in no acute distress.  HENT:  Head: Normocephalic and atraumatic.  Mouth/Throat: Oropharynx is clear and moist.  Eyes: Conjunctivae and EOM are normal. Pupils are equal, round, and reactive to light. Right eye exhibits no discharge. Left eye exhibits no discharge. No scleral icterus.  Neck: Normal range of motion. Neck supple. No thyromegaly present.  Cardiovascular: Normal rate, regular rhythm, normal heart sounds and intact distal pulses.  Exam reveals no gallop and no friction rub.   No murmur heard. Pulmonary/Chest: Effort normal and breath sounds normal. No respiratory distress. She exhibits no tenderness.  Abdominal: Soft. Bowel sounds are normal. She exhibits no distension. There is no tenderness. There is no rebound and no guarding.  Musculoskeletal: Normal range of motion.  Patient does have tenderness with palpation between the shoulder blades. No deformity or crepitus noted. Full range of motion. Denies injury.  Lymphadenopathy:    She has no cervical adenopathy.  Neurological: She is alert.  Skin: Skin is warm and dry. Capillary refill takes less than 2 seconds.  Nursing note and vitals reviewed.    ED Treatments / Results  Labs (all labs ordered are listed, but only abnormal results are displayed) Labs Reviewed  COMPREHENSIVE METABOLIC PANEL - Abnormal; Notable for the following:       Result Value   Glucose, Bld 142 (*)    Calcium 8.7 (*)    All other components within normal limits  URINALYSIS, ROUTINE W REFLEX MICROSCOPIC - Abnormal; Notable for the following:    APPearance CLOUDY (*)    All other components within normal limits  CBC  TROPONIN I   D-DIMER, QUANTITATIVE (NOT AT [facility])  TROPONIN I    EKG  EKG Interpretation  Date/Time:  Saturday December 25 2016 09:50:22 EST Ventricular Rate:  90 PR Interval:    QRS Duration: 100 QT Interval:  370 QTC Calculation: 453 R Axis:   47 Text Interpretation:  Sinus rhythm Anteroseptal infarct, age indeterminate No significant change since last tracing Confirmed by [facility]  MD, MARTHA [phone]) on 12/25/2016 11:04:59 AM       Radiology Dg Chest 2 View  Result Date: 12/25/2016 CLINICAL DATA:  Chest pain. EXAM: CHEST  2 VIEW COMPARISON:  11/25/2012 FINDINGS: The heart size is normal. The aorta shows stable ectasia. Atelectasis present at the right lung base. There is no evidence of pulmonary edema, consolidation, pneumothorax, nodule or pleural fluid. The visualized skeletal structures are unremarkable. IMPRESSION: Right basilar atelectasis.  No acute findings. Electronically Signed   By: Aletta Edouard M.D.   On: 12/25/2016 10:22    Procedures Procedures (including critical care time)  Medications Ordered in ED Medications  aspirin chewable tablet 243 mg (243 mg Oral Given 12/25/16 1036)     Initial Impression / Assessment and Plan / ED Course  I have reviewed the triage vital signs and the nursing notes.  Pertinent labs & imaging results that were available during my care of the patient were reviewed by me and considered in my medical decision making (see chart for details).    Patient is to be discharged with recommendation to follow up with PCP in regards to today's hospital visit. Heart pathway score is 3 due to age and risk factors. EKG without any acute changes. Delta troponins were obtained. Chest pain is not likely of cardiac or pulmonary etiology d/t presentation, d dimer negative, VSS, no tracheal deviation, no JVD or new murmur, RRR, breath sounds equal bilaterally, EKG without acute abnormalities, negative troponin, and negative CXR. Patient states that her pain has  resolved since arriving to the ED. Patient is currently pain-free in the room. Pt has been advised return to the ED is CP becomes exertional, associated with diaphoresis or nausea, radiates to left jaw/arm, worsens or becomes concerning in any way. Pt appears reliable for follow up and is agreeable to discharge.   Case has been discussed with and seen by Dr. Canary Brim who agrees with the above plan to discharge.    Final Clinical Impressions(s) / ED Diagnoses   Final diagnoses:  Chest pain, unspecified type    New Prescriptions Discharge Medication List as of 12/25/2016  2:29 PM       Doristine Devoid, PA-C 12/26/16 [place], MD 12/26/16 1544

## 2017-01-21 DIAGNOSIS — J101 Influenza due to other identified influenza virus with other respiratory manifestations: Secondary | ICD-10-CM | POA: Diagnosis not present

## 2017-01-25 DIAGNOSIS — J209 Acute bronchitis, unspecified: Secondary | ICD-10-CM | POA: Diagnosis not present

## 2017-04-14 DIAGNOSIS — Z780 Asymptomatic menopausal state: Secondary | ICD-10-CM | POA: Diagnosis not present

## 2017-04-14 DIAGNOSIS — Z13820 Encounter for screening for osteoporosis: Secondary | ICD-10-CM | POA: Diagnosis not present

## 2017-04-20 ENCOUNTER — Ambulatory Visit (INDEPENDENT_AMBULATORY_CARE_PROVIDER_SITE_OTHER): Payer: Self-pay

## 2017-04-20 ENCOUNTER — Ambulatory Visit (INDEPENDENT_AMBULATORY_CARE_PROVIDER_SITE_OTHER): Payer: Medicare HMO

## 2017-04-20 ENCOUNTER — Encounter (INDEPENDENT_AMBULATORY_CARE_PROVIDER_SITE_OTHER): Payer: Self-pay | Admitting: Orthopedic Surgery

## 2017-04-20 ENCOUNTER — Ambulatory Visit (INDEPENDENT_AMBULATORY_CARE_PROVIDER_SITE_OTHER): Payer: Medicare HMO | Admitting: Orthopedic Surgery

## 2017-04-20 ENCOUNTER — Encounter (INDEPENDENT_AMBULATORY_CARE_PROVIDER_SITE_OTHER): Payer: Self-pay

## 2017-04-20 VITALS — BP 171/98 | HR 91 | Resp 18 | Wt 258.0 lb

## 2017-04-20 DIAGNOSIS — M79675 Pain in left toe(s): Secondary | ICD-10-CM | POA: Diagnosis not present

## 2017-04-20 DIAGNOSIS — M79676 Pain in unspecified toe(s): Secondary | ICD-10-CM | POA: Diagnosis not present

## 2017-04-20 DIAGNOSIS — S92425A Nondisplaced fracture of distal phalanx of left great toe, initial encounter for closed fracture: Secondary | ICD-10-CM | POA: Diagnosis not present

## 2017-04-20 DIAGNOSIS — S92502A Displaced unspecified fracture of left lesser toe(s), initial encounter for closed fracture: Secondary | ICD-10-CM

## 2017-04-20 MED ORDER — TRAMADOL HCL 50 MG PO TABS: 50.0000 mg | ORAL_TABLET | Freq: Four times a day (QID) | ORAL | 0 refills | Status: DC | PRN

## 2017-04-20 NOTE — Progress Notes (Signed)
Office Visit Note   Patient: Suzanne Patterson           Date of Birth: 28-Jul-1951           MRN: [id number] Visit Date: 04/20/2017              Requested by: [name], [address], [place] [id number] PCP: [name], MD   Assessment & Plan: Visit Diagnoses:  1. Closed fracture of phalanx of left fifth toe, initial encounter   2. Pain of fifth toe     Plan:  #1: Closed reduction of the proximal phalanx of the fifth toe left foot was performed with marked improvement in the position. Buddy taped with a bolster in the webspace  Follow-Up Instructions: Return in about 5 days (around 04/25/2017).   Orders:  Orders Placed This Encounter  Procedures  . XR Toe 5th Left  . XR Toe 5th Left   Meds ordered this encounter  Medications  . traMADol (ULTRAM) 50 MG tablet    Sig: Take 1 tablet (50 mg total) by mouth every 6 (six) hours as needed.    Dispense:  15 tablet    Refill:  0    Order Specific Question:   Supervising Provider    Answer:   Garald Balding [[id number]]      Procedures: No procedures performed   Clinical Data: No additional findings.   Subjective: Chief Complaint  Patient presents with  . Toe Injury    left 5th toe pain     Probably is a very pleasant 66 year old white female who is seen today for evaluation of her left little toe. Apparently she was walking today and caught the toe on a door jam. This caused immediate excruciating pain now to the little toe. She called for an appointment she comes in today for evaluation. She now denies any numbness or tingling. Pain is mainly in the proximal portion of the fifth toe. No previous history of injury or traumas noted.    Review of Systems  Constitutional: Negative.   HENT: Negative.   Respiratory: Negative.   Cardiovascular: Negative.   Gastrointestinal: Negative.   Genitourinary: Negative.   Skin: Negative.   Neurological: Negative.   Hematological: Negative.     Psychiatric/Behavioral: Negative.      Objective: Vital Signs: BP (!) 171/98   Pulse 91   Resp 18   Wt 258 lb (117 kg)   BMI 42.93 kg/m   Physical Exam  Constitutional: She is oriented to person, place, and time. She appears well-developed and well-nourished.  HENT:  Head: Normocephalic and atraumatic.  Eyes: EOM are normal. Pupils are equal, round, and reactive to light.  Pulmonary/Chest: Effort normal.  Neurological: She is alert and oriented to person, place, and time.  Skin: Skin is warm and dry.  Psychiatric: She has a normal mood and affect. Her behavior is normal. Judgment and thought content normal.    Ortho Exam  The fifth toe is examined today noted to have abduction of the little toe comparison to the fourth. There is widening of the webspace.  Palpation at the proximal portion of the little toe. Capillary refill. Sensation is intact to light touch.  Specialty Comments:  No specialty comments available.  Imaging: Xr Toe 5th Left  Result Date: 04/20/2017 Post reduction film of the left little toe proximal phalanx fracture reveals the much more acceptable position of the fracture up. Again I believe this is a 3 part  fracture up.  Xr Toe 5th Left  Result Date: 04/20/2017 Three-view x-ray of the left foot reveals an angulated proximal phalanx fracture of the little toe. There may be a three-part fracture.    PMFS History: There are no active problems to display for this patient.  Past Medical History:  Diagnosis Date  . Dysrhythmia   . GERD (gastroesophageal reflux disease)   . Hypertension     No family history on file.  Past Surgical History:  Procedure Laterality Date  . FOOT SURGERY Right    Social History   Occupational History  . Not on file.   Social History Main Topics  . Smoking status: Never Smoker  . Smokeless tobacco: Never Used  . Alcohol use No  . Drug use: No  . Sexual activity: Yes    Birth control/ protection:  Post-menopausal

## 2017-04-25 ENCOUNTER — Telehealth (INDEPENDENT_AMBULATORY_CARE_PROVIDER_SITE_OTHER): Payer: Self-pay | Admitting: Orthopaedic Surgery

## 2017-04-25 ENCOUNTER — Ambulatory Visit (INDEPENDENT_AMBULATORY_CARE_PROVIDER_SITE_OTHER): Payer: Medicare HMO

## 2017-04-25 ENCOUNTER — Ambulatory Visit (INDEPENDENT_AMBULATORY_CARE_PROVIDER_SITE_OTHER): Payer: Medicare HMO | Admitting: Orthopaedic Surgery

## 2017-04-25 ENCOUNTER — Encounter (INDEPENDENT_AMBULATORY_CARE_PROVIDER_SITE_OTHER): Payer: Self-pay | Admitting: Orthopaedic Surgery

## 2017-04-25 VITALS — BP 154/93 | HR 95 | Resp 14 | Ht 66.0 in | Wt 258.0 lb

## 2017-04-25 DIAGNOSIS — M79672 Pain in left foot: Secondary | ICD-10-CM

## 2017-04-25 NOTE — Telephone Encounter (Signed)
Called Pt and told her yes to the tape per PW

## 2017-04-25 NOTE — Telephone Encounter (Signed)
Patient wants to know if she is to keep the rolled up ball of tape between her toes. ROV in two weeks. Please call to advise.

## 2017-04-25 NOTE — Progress Notes (Signed)
   Office Visit Note   Patient: Suzanne Patterson           Date of Birth: July 18, 1951           MRN: [id number] Visit Date: 04/25/2017              Requested by: [name], [place], [place] [address] PCP: [name], MD   Assessment & Plan: Visit Diagnoses:  1. Left foot pain   1 week post reduction of a closed displaced fracture the base of the proximal phalanx left little toe. Wearing wooden shoe and tape between the fourth and fifth toes and doing well  Plan: Office 2 weeks for follow-up films continue with buddy taping and wooden shoe  Follow-Up Instructions: No Follow-up on file.   Orders:  Orders Placed This Encounter  Procedures  . XR Foot Complete Left   No orders of the defined types were placed in this encounter.     Procedures: No procedures performed   Clinical Data: No additional findings.   Subjective: Chief Complaint  Patient presents with  . Left 5th Toe - Follow-up    Ms. Friend is a 66 y o that is here for a F/U of Closed reduction of the proximal phalanx of the fifth toe left foot was performed with marked improvement in the position. Buddy taped with a bolster in the webspace. Not as sore, slightly swollen.    HPI  Review of Systems   Objective: Vital Signs: BP (!) 154/93   Pulse 95   Resp 14   Ht 5\' 6"  (1.676 m)   Wt 258 lb (117 kg)   BMI 41.64 kg/m   Physical Exam  Ortho Exam tape removed from fourth fifth toe left foot. Skin intact. Alignment looks just fine. Mild swelling of the little toe. Good capillary refill. Normal sensibility. Specialty Comments:  No specialty comments available.  Imaging: Xr Foot Complete Left  Result Date: 04/25/2017 Alignment of previously reduced fracture at the base of the proximal phalanx left little toe is essentially anatomic    PMFS History: There are no active problems to display for this patient.  Past Medical History:  Diagnosis Date  .  Dysrhythmia   . GERD (gastroesophageal reflux disease)   . Hypertension     History reviewed. No pertinent family history.  Past Surgical History:  Procedure Laterality Date  . FOOT SURGERY Right    Social History   Occupational History  . Not on file.   Social History Main Topics  . Smoking status: Never Smoker  . Smokeless tobacco: Never Used  . Alcohol use No  . Drug use: No  . Sexual activity: Yes    Birth control/ protection: Post-menopausal     Garald Balding, MD   Note - This record has been created using Editor, commissioning.  Chart creation errors have been sought, but may not always  have been located. Such creation errors do not reflect on  the standard of medical care.

## 2017-05-09 ENCOUNTER — Ambulatory Visit (INDEPENDENT_AMBULATORY_CARE_PROVIDER_SITE_OTHER): Payer: Self-pay

## 2017-05-09 ENCOUNTER — Encounter (INDEPENDENT_AMBULATORY_CARE_PROVIDER_SITE_OTHER): Payer: Self-pay | Admitting: Orthopaedic Surgery

## 2017-05-09 ENCOUNTER — Ambulatory Visit (INDEPENDENT_AMBULATORY_CARE_PROVIDER_SITE_OTHER): Payer: Medicare HMO | Admitting: Orthopaedic Surgery

## 2017-05-09 VITALS — Ht 66.5 in | Wt 258.0 lb

## 2017-05-09 DIAGNOSIS — M79675 Pain in left toe(s): Secondary | ICD-10-CM

## 2017-05-09 NOTE — Progress Notes (Signed)
   Office Visit Note   Patient: Suzanne Patterson           Date of Birth: 06-Feb-1951           MRN: [id number] Visit Date: 05/09/2017              Requested by: [name], [place], [place] [address] PCP: [name], MD   Assessment & Plan: Visit Diagnoses:  1. Toe pain, left   3 weeks status post displaced closed fracture of the base of the proximal phalanx of the left little toe and doing well  Plan: Continue taping fourth and fifth toes left foot for the next week and wear wooden shoe. At 1 week remove the tape wear the shoe for an additional week. We'll see her back in apparent basis. No further x-rays  Follow-Up Instructions: Return if symptoms worsen or fail to improve.   Orders:  No orders of the defined types were placed in this encounter.  No orders of the defined types were placed in this encounter.     Procedures: No procedures performed   Clinical Data: No additional findings.   Subjective: Chief Complaint  Patient presents with  . Left Foot - Toe Injury, Follow-up   3 weeks post injury with displaced fracture the base of the proximal phalanx left little toe requiring manipulation. Doing well at present with little if any pain with it fourth and fifth toes taped and wearing a wooden shoe. Not using any ambulatory aid HPI  Review of Systems   Objective: Vital Signs: Ht 5' 6.5" (1.689 m)   Wt 258 lb (117 kg)   BMI 41.02 kg/m   Physical Exam  Ortho Exam neurovascular exam intact to both fourth and fifth toes left foot. No skin changes. Minimal edema. Little toe remains straight with normal alignment after removal of the tape.  Specialty Comments:  No specialty comments available.  Imaging: No results found.   PMFS History: There are no active problems to display for this patient.  Past Medical History:  Diagnosis Date  . Dysrhythmia   . GERD (gastroesophageal reflux disease)   . Hypertension       History reviewed. No pertinent family history.  Past Surgical History:  Procedure Laterality Date  . FOOT SURGERY Right    Social History   Occupational History  . Not on file.   Social History Main Topics  . Smoking status: Never Smoker  . Smokeless tobacco: Never Used  . Alcohol use No  . Drug use: No  . Sexual activity: Yes    Birth control/ protection: Post-menopausal     Garald Balding, MD   Note - This record has been created using Editor, commissioning.  Chart creation errors have been sought, but may not always  have been located. Such creation errors do not reflect on  the standard of medical care.

## 2017-05-27 DIAGNOSIS — L237 Allergic contact dermatitis due to plants, except food: Secondary | ICD-10-CM | POA: Diagnosis not present

## 2017-07-11 DIAGNOSIS — E119 Type 2 diabetes mellitus without complications: Secondary | ICD-10-CM | POA: Diagnosis not present

## 2017-07-11 DIAGNOSIS — I10 Essential (primary) hypertension: Secondary | ICD-10-CM | POA: Diagnosis not present

## 2017-07-11 DIAGNOSIS — E7800 Pure hypercholesterolemia, unspecified: Secondary | ICD-10-CM | POA: Diagnosis not present

## 2017-07-11 DIAGNOSIS — N63 Unspecified lump in unspecified breast: Secondary | ICD-10-CM | POA: Diagnosis not present

## 2017-08-03 DIAGNOSIS — N898 Other specified noninflammatory disorders of vagina: Secondary | ICD-10-CM | POA: Diagnosis not present

## 2017-08-03 DIAGNOSIS — Z713 Dietary counseling and surveillance: Secondary | ICD-10-CM | POA: Diagnosis not present

## 2017-08-03 DIAGNOSIS — I10 Essential (primary) hypertension: Secondary | ICD-10-CM | POA: Diagnosis not present

## 2017-09-12 DIAGNOSIS — Z23 Encounter for immunization: Secondary | ICD-10-CM | POA: Diagnosis not present

## 2017-09-12 DIAGNOSIS — I10 Essential (primary) hypertension: Secondary | ICD-10-CM | POA: Diagnosis not present

## 2017-09-12 DIAGNOSIS — R7303 Prediabetes: Secondary | ICD-10-CM | POA: Diagnosis not present

## 2017-09-14 DIAGNOSIS — L821 Other seborrheic keratosis: Secondary | ICD-10-CM | POA: Diagnosis not present

## 2017-11-01 DIAGNOSIS — I10 Essential (primary) hypertension: Secondary | ICD-10-CM | POA: Diagnosis not present

## 2017-11-01 DIAGNOSIS — E119 Type 2 diabetes mellitus without complications: Secondary | ICD-10-CM | POA: Diagnosis not present

## 2017-11-08 ENCOUNTER — Other Ambulatory Visit: Payer: Self-pay | Admitting: Family Medicine

## 2017-11-08 ENCOUNTER — Other Ambulatory Visit (HOSPITAL_COMMUNITY)
Admission: RE | Admit: 2017-11-08 | Discharge: 2017-11-08 | Disposition: A | Discharge status: Home / Self Care | Payer: Medicare HMO | Source: Ambulatory Visit | Attending: Family Medicine | Admitting: Family Medicine

## 2017-11-08 DIAGNOSIS — Z124 Encounter for screening for malignant neoplasm of cervix: Secondary | ICD-10-CM | POA: Diagnosis not present

## 2017-11-08 DIAGNOSIS — F419 Anxiety disorder, unspecified: Secondary | ICD-10-CM | POA: Diagnosis not present

## 2017-11-08 DIAGNOSIS — Z6841 Body Mass Index (BMI) 40.0 and over, adult: Secondary | ICD-10-CM | POA: Diagnosis not present

## 2017-11-08 DIAGNOSIS — I10 Essential (primary) hypertension: Secondary | ICD-10-CM | POA: Diagnosis not present

## 2017-11-08 DIAGNOSIS — E119 Type 2 diabetes mellitus without complications: Secondary | ICD-10-CM | POA: Diagnosis not present

## 2017-11-08 DIAGNOSIS — Z1389 Encounter for screening for other disorder: Secondary | ICD-10-CM | POA: Diagnosis not present

## 2017-11-08 DIAGNOSIS — E7800 Pure hypercholesterolemia, unspecified: Secondary | ICD-10-CM | POA: Diagnosis not present

## 2017-11-08 DIAGNOSIS — Z0000 Encounter for general adult medical examination without abnormal findings: Secondary | ICD-10-CM | POA: Diagnosis not present

## 2017-11-08 DIAGNOSIS — Z1211 Encounter for screening for malignant neoplasm of colon: Secondary | ICD-10-CM | POA: Diagnosis not present

## 2017-11-09 LAB — CYTOLOGY - PAP
DIAGNOSIS: NEGATIVE
HPV: NOT DETECTED

## 2017-11-10 ENCOUNTER — Encounter (HOSPITAL_BASED_OUTPATIENT_CLINIC_OR_DEPARTMENT_OTHER): Payer: Self-pay | Admitting: *Deleted

## 2017-11-10 ENCOUNTER — Other Ambulatory Visit: Payer: Self-pay

## 2017-11-10 ENCOUNTER — Emergency Department (HOSPITAL_BASED_OUTPATIENT_CLINIC_OR_DEPARTMENT_OTHER)
Admission: EM | Admit: 2017-11-10 | Discharge: 2017-11-10 | Discharge status: Against Medical Advice | Payer: Medicare HMO | Attending: Emergency Medicine | Admitting: Emergency Medicine

## 2017-11-10 DIAGNOSIS — Z733 Stress, not elsewhere classified: Secondary | ICD-10-CM | POA: Insufficient documentation

## 2017-11-10 DIAGNOSIS — Z5321 Procedure and treatment not carried out due to patient leaving prior to being seen by health care provider: Secondary | ICD-10-CM | POA: Diagnosis not present

## 2017-11-10 NOTE — ED Notes (Signed)
Pt states she spoke to PMD and and is leaving here to go there, VSS

## 2017-11-10 NOTE — ED Triage Notes (Addendum)
Pt c/o increased BP and increased stress , pt states she is going to take 1/2  Tab of her xanax now

## 2017-11-18 DIAGNOSIS — J0100 Acute maxillary sinusitis, unspecified: Secondary | ICD-10-CM | POA: Diagnosis not present

## 2017-11-22 DIAGNOSIS — I10 Essential (primary) hypertension: Secondary | ICD-10-CM | POA: Diagnosis not present

## 2017-11-22 DIAGNOSIS — E119 Type 2 diabetes mellitus without complications: Secondary | ICD-10-CM | POA: Diagnosis not present

## 2017-11-22 DIAGNOSIS — F419 Anxiety disorder, unspecified: Secondary | ICD-10-CM | POA: Diagnosis not present

## 2017-11-23 DIAGNOSIS — Z0100 Encounter for examination of eyes and vision without abnormal findings: Secondary | ICD-10-CM | POA: Diagnosis not present

## 2017-11-25 ENCOUNTER — Ambulatory Visit: Payer: Medicare HMO | Admitting: Registered"

## 2017-11-28 ENCOUNTER — Other Ambulatory Visit: Payer: Self-pay | Admitting: Family Medicine

## 2017-11-28 DIAGNOSIS — R1011 Right upper quadrant pain: Secondary | ICD-10-CM

## 2017-11-28 DIAGNOSIS — Z6841 Body Mass Index (BMI) 40.0 and over, adult: Secondary | ICD-10-CM | POA: Diagnosis not present

## 2017-11-28 DIAGNOSIS — E6609 Other obesity due to excess calories: Secondary | ICD-10-CM | POA: Diagnosis not present

## 2017-11-28 DIAGNOSIS — R109 Unspecified abdominal pain: Secondary | ICD-10-CM | POA: Diagnosis not present

## 2017-11-28 DIAGNOSIS — I10 Essential (primary) hypertension: Secondary | ICD-10-CM | POA: Diagnosis not present

## 2017-12-01 ENCOUNTER — Ambulatory Visit
Admission: RE | Admit: 2017-12-01 | Discharge: 2017-12-01 | Disposition: A | Discharge status: Home / Self Care | Payer: Medicare HMO | Source: Ambulatory Visit | Attending: Family Medicine | Admitting: Family Medicine

## 2017-12-01 DIAGNOSIS — R1011 Right upper quadrant pain: Secondary | ICD-10-CM

## 2017-12-06 DIAGNOSIS — M79675 Pain in left toe(s): Secondary | ICD-10-CM | POA: Diagnosis not present

## 2018-01-11 DIAGNOSIS — J029 Acute pharyngitis, unspecified: Secondary | ICD-10-CM | POA: Diagnosis not present

## 2018-01-27 DIAGNOSIS — J019 Acute sinusitis, unspecified: Secondary | ICD-10-CM | POA: Diagnosis not present

## 2018-02-16 DIAGNOSIS — R1013 Epigastric pain: Secondary | ICD-10-CM | POA: Diagnosis not present

## 2018-02-16 DIAGNOSIS — I10 Essential (primary) hypertension: Secondary | ICD-10-CM | POA: Diagnosis not present

## 2018-02-22 IMAGING — CR DG CHEST 2V
2 series · 2 of 2 positions shown · non-contrast
Comparison: 11/25/2012

CLINICAL DATA: Chest pain.

EXAM:
CHEST  2 VIEW

[w chest pa]
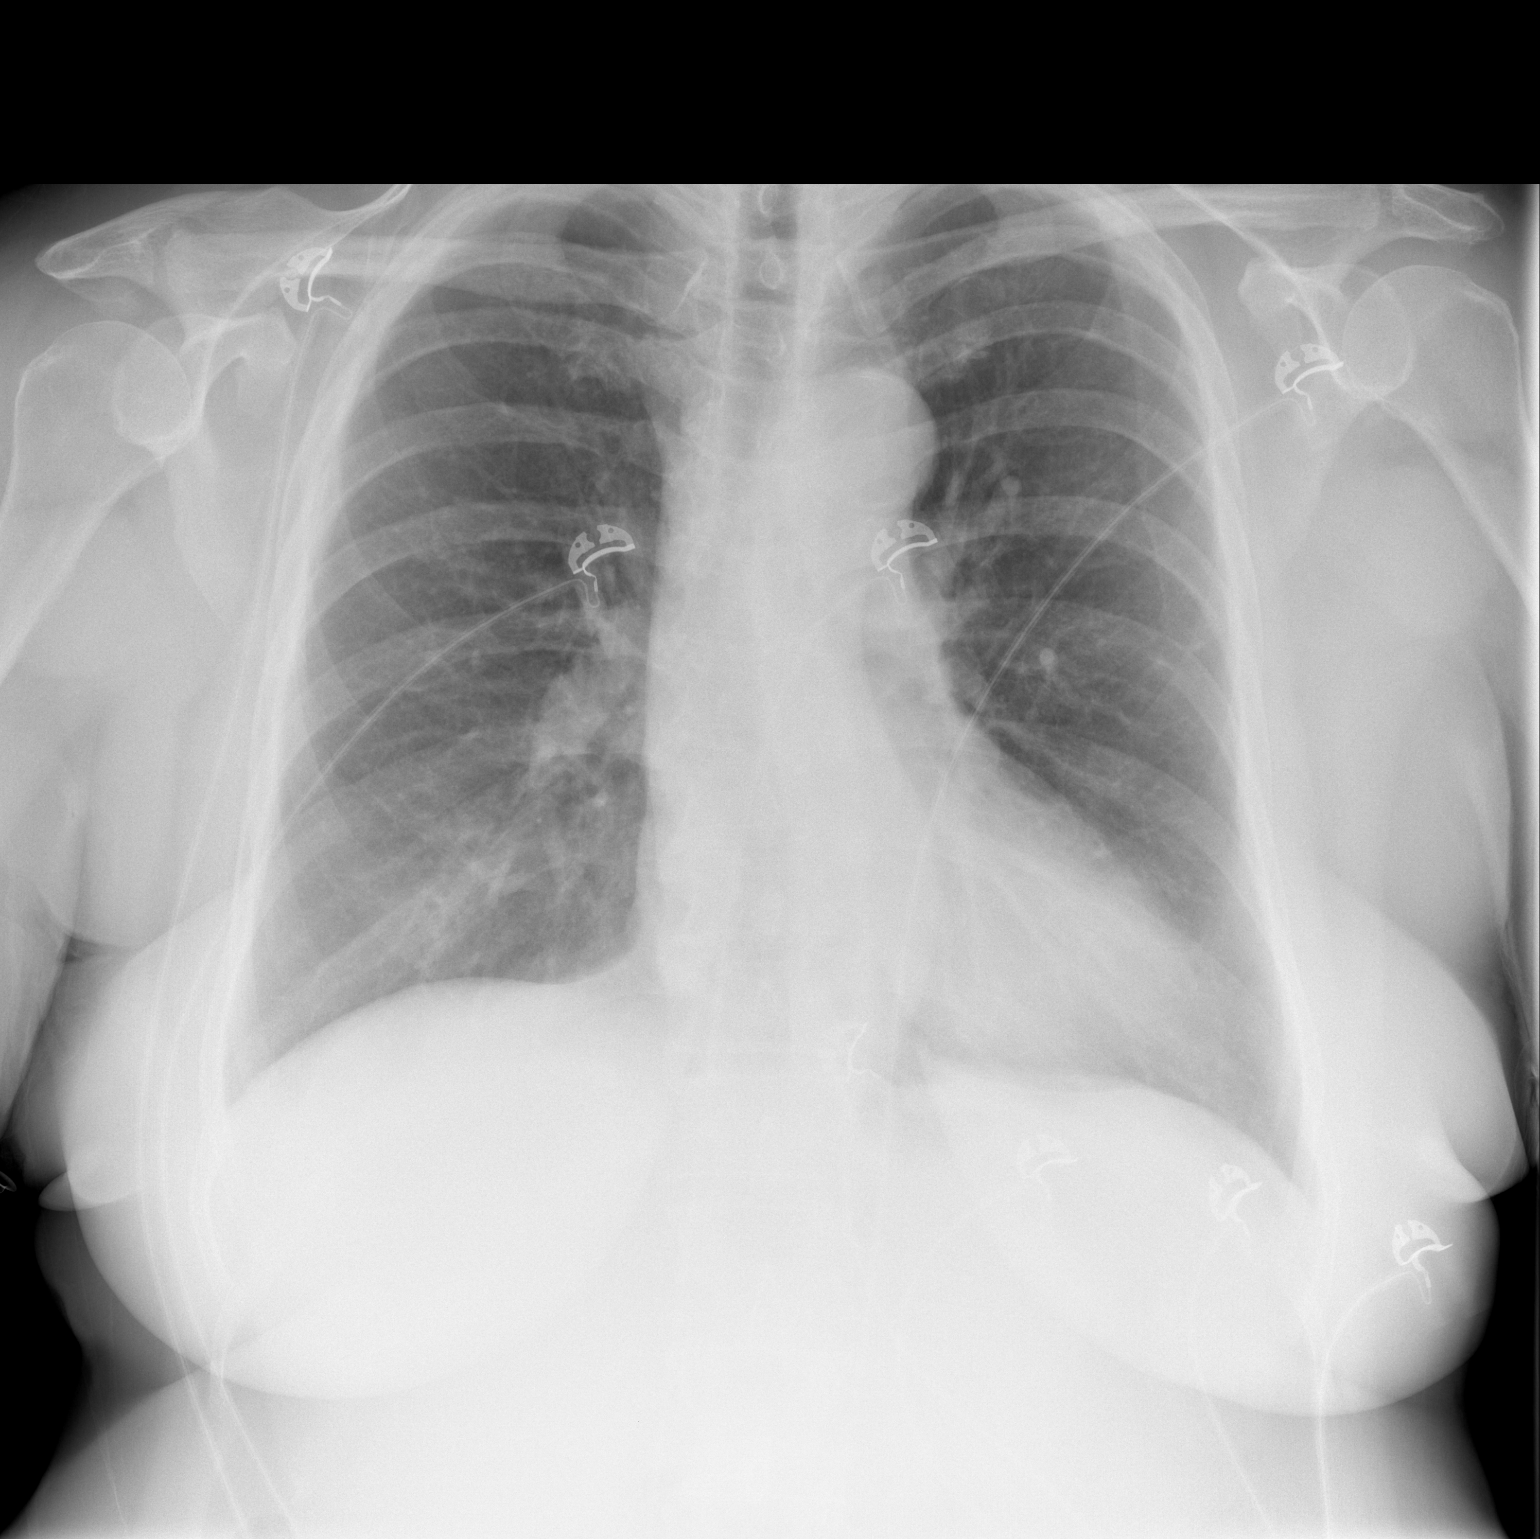

[w chest lat]
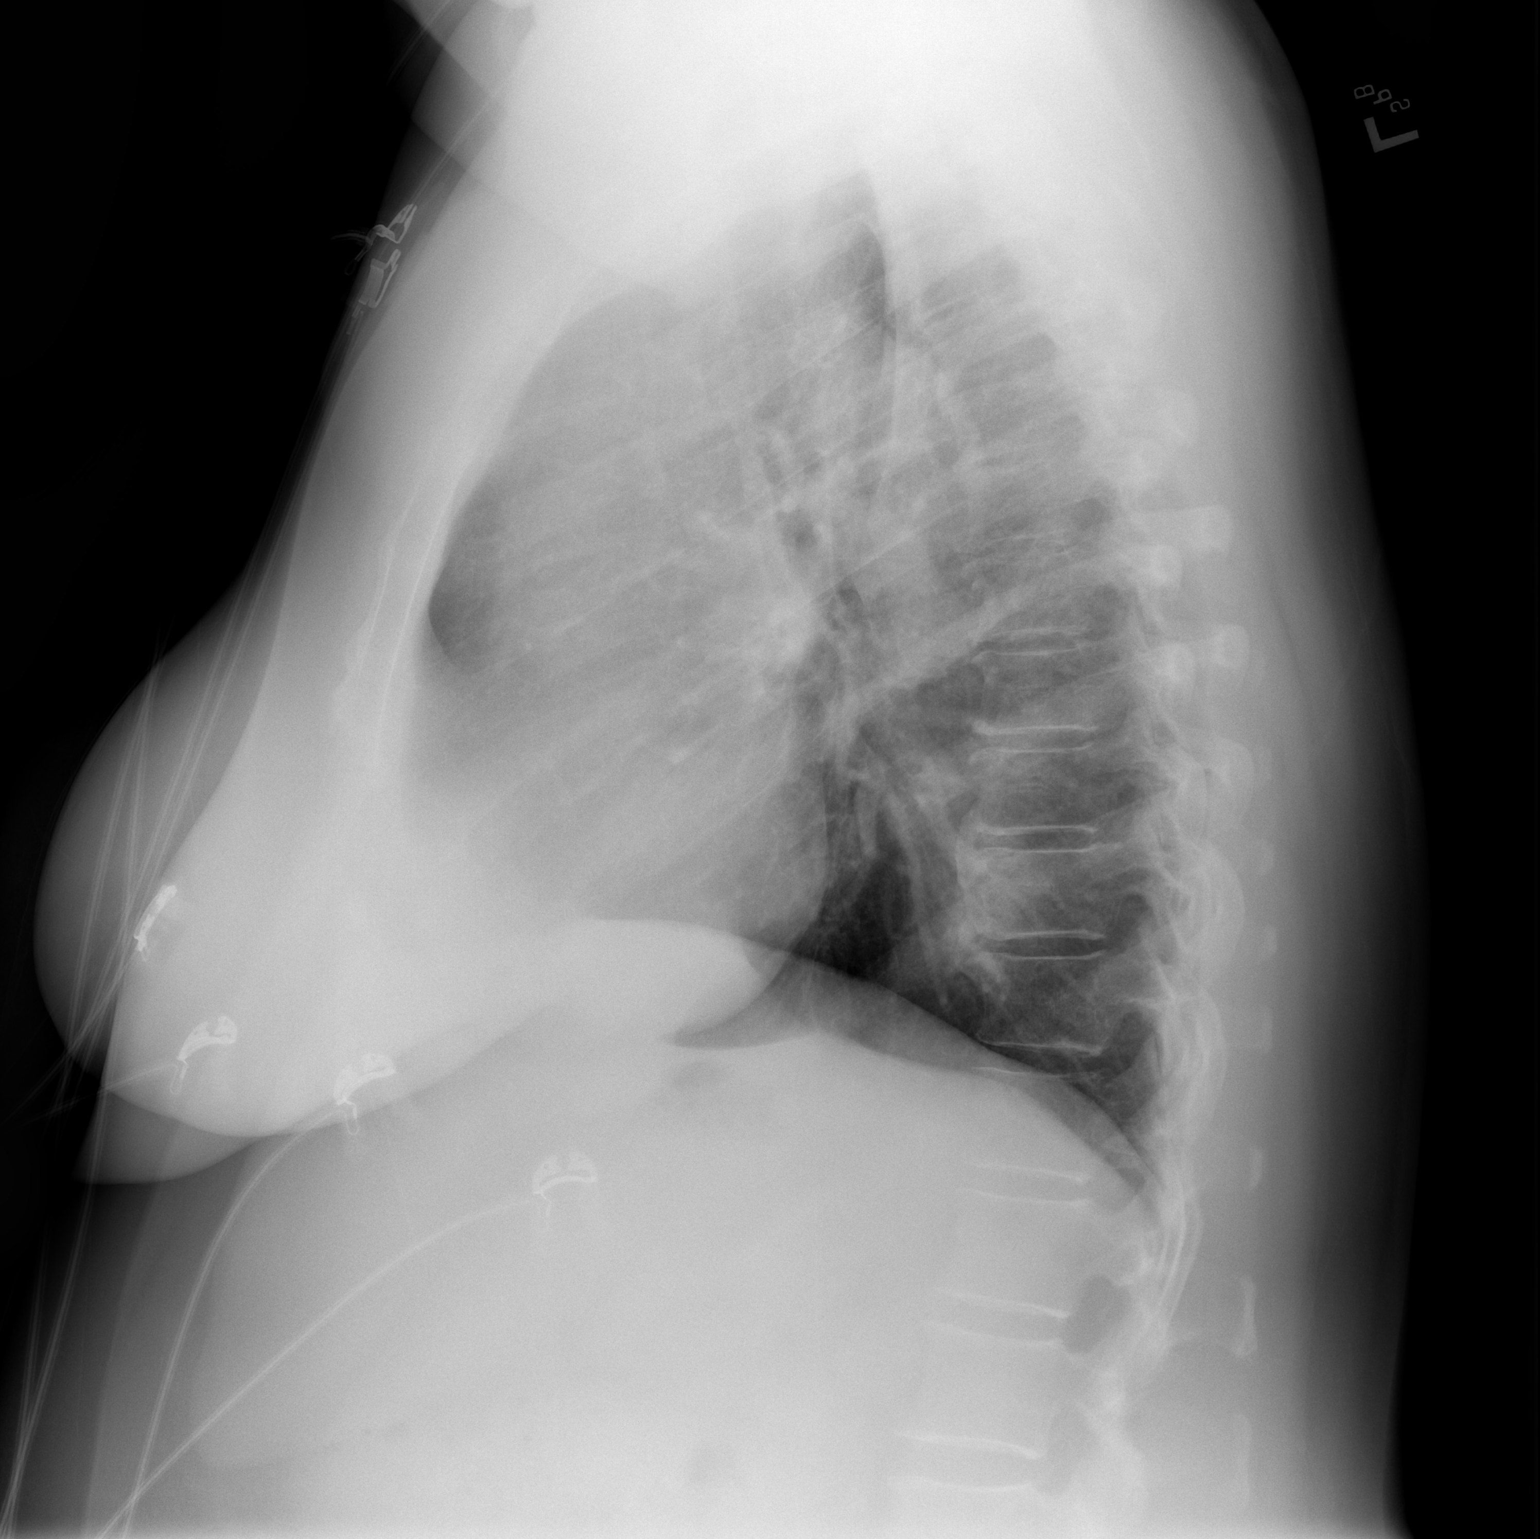

[2 of 2 positions shown; findings below may reference images not displayed]

FINDINGS: The heart size is normal. The aorta shows stable ectasia.
Atelectasis present at the right lung base. There is no evidence of
pulmonary edema, consolidation, pneumothorax, nodule or pleural
fluid. The visualized skeletal structures are unremarkable.
IMPRESSION: Right basilar atelectasis.  No acute findings.

## 2018-02-23 DIAGNOSIS — E7800 Pure hypercholesterolemia, unspecified: Secondary | ICD-10-CM | POA: Diagnosis not present

## 2018-02-23 DIAGNOSIS — F439 Reaction to severe stress, unspecified: Secondary | ICD-10-CM | POA: Diagnosis not present

## 2018-02-23 DIAGNOSIS — E119 Type 2 diabetes mellitus without complications: Secondary | ICD-10-CM | POA: Diagnosis not present

## 2018-02-23 DIAGNOSIS — I10 Essential (primary) hypertension: Secondary | ICD-10-CM | POA: Diagnosis not present

## 2018-03-08 DIAGNOSIS — B373 Candidiasis of vulva and vagina: Secondary | ICD-10-CM | POA: Diagnosis not present

## 2018-03-08 DIAGNOSIS — N898 Other specified noninflammatory disorders of vagina: Secondary | ICD-10-CM | POA: Diagnosis not present

## 2018-03-17 DIAGNOSIS — L600 Ingrowing nail: Secondary | ICD-10-CM | POA: Diagnosis not present

## 2018-03-17 DIAGNOSIS — M79675 Pain in left toe(s): Secondary | ICD-10-CM | POA: Diagnosis not present

## 2018-03-17 DIAGNOSIS — M79674 Pain in right toe(s): Secondary | ICD-10-CM | POA: Diagnosis not present

## 2018-04-10 DIAGNOSIS — L600 Ingrowing nail: Secondary | ICD-10-CM | POA: Diagnosis not present

## 2018-05-29 DIAGNOSIS — F419 Anxiety disorder, unspecified: Secondary | ICD-10-CM | POA: Diagnosis not present

## 2018-05-29 DIAGNOSIS — I10 Essential (primary) hypertension: Secondary | ICD-10-CM | POA: Diagnosis not present

## 2018-06-06 ENCOUNTER — Encounter (HOSPITAL_BASED_OUTPATIENT_CLINIC_OR_DEPARTMENT_OTHER): Payer: Self-pay

## 2018-06-06 ENCOUNTER — Other Ambulatory Visit: Payer: Self-pay

## 2018-06-06 ENCOUNTER — Emergency Department (HOSPITAL_BASED_OUTPATIENT_CLINIC_OR_DEPARTMENT_OTHER)
Admission: EM | Admit: 2018-06-06 | Discharge: 2018-06-06 | Disposition: A | Discharge status: Home / Self Care | Payer: Medicare HMO | Attending: Emergency Medicine | Admitting: Emergency Medicine

## 2018-06-06 DIAGNOSIS — Z7982 Long term (current) use of aspirin: Secondary | ICD-10-CM | POA: Insufficient documentation

## 2018-06-06 DIAGNOSIS — I10 Essential (primary) hypertension: Secondary | ICD-10-CM | POA: Diagnosis not present

## 2018-06-06 DIAGNOSIS — Z79891 Long term (current) use of opiate analgesic: Secondary | ICD-10-CM | POA: Diagnosis not present

## 2018-06-06 NOTE — Discharge Instructions (Addendum)
Blood pressures with [place] were: 156/96 and 172/76.   Follow-up with your PCP about adjustments that need to be made to blood pressure medications.

## 2018-06-06 NOTE — ED Triage Notes (Addendum)
Pt c/o elevated BP "for a long time"-states PCP working with meds and advised her to come in due to SBP >200 and DBP >100 on home BP machine-pt c/o HA x today not relieved by motrin-NAD-steady gait

## 2018-06-06 NOTE — ED Provider Notes (Signed)
[facility] EMERGENCY DEPARTMENT Provider Note  CSN: [id number] Arrival date & time: 06/06/18  1947  History   Chief Complaint No chief complaint on file.   HPI Suzanne Patterson is a 67 y.o. female with a medical history of GERD and HTN who presented to the ED for elevated BP readings at home. Patient currently on 3 anti-hypertensives and has taken her BP at least 10 times over the last 2 days. Readings fluctuate from ~150/90-170/110. Denies chest pain, SOB, palpitations, leg swelling, vision changes, changes in bowel or urinary habits, paresthesias, slurred speech, facial droop. She endorses headache x1 today after working outside in the heat with a friend. She states that headache resolved with Tylenol and that she has not had other headaches prior to today.   Past Medical History:  Diagnosis Date  . Dysrhythmia   . GERD (gastroesophageal reflux disease)   . Hypertension     There are no active problems to display for this patient.   Past Surgical History:  Procedure Laterality Date  . FOOT SURGERY Right      OB History   None      Home Medications    Prior to Admission medications   Medication Sig Start Date End Date Taking? Authorizing Provider  ALPRAZolam Duanne Moron) 0.5 MG tablet Take 0.5 mg by mouth at bedtime as needed for anxiety.    [provider]  aspirin 81 MG chewable tablet Chew 81 mg by mouth daily.    [provider]  carvedilol (COREG) 12.5 MG tablet Take 12.5 mg by mouth 2 (two) times daily. 05/29/18   [provider]  cetirizine (ZYRTEC) 10 MG tablet Take 10 mg by mouth daily.      [provider]  escitalopram (LEXAPRO) 5 MG tablet Take 5 mg by mouth daily. 05/29/18   [provider]  esomeprazole (NEXIUM) 40 MG capsule Take 40 mg by mouth daily before breakfast.      [provider]  labetalol (NORMODYNE) 100 MG tablet TAKE 1 TABLET BY MOUTH TWICE DAILY FOR 30 DAYS 05/24/18   [provider]  olmesartan-hydrochlorothiazide (BENICAR HCT) 40-25 MG tablet Take 1 tablet by mouth daily.    [provider]  rosuvastatin (CRESTOR) 5 MG tablet  05/23/18   [provider]  traMADol (ULTRAM) 50 MG tablet Take 1 tablet (50 mg total) by mouth every 6 (six) hours as needed. 04/20/17   Cherylann Ratel, PA-C    Family History No family history on file.  Social History Social History   Tobacco Use  . Smoking status: Never Smoker  . Smokeless tobacco: Never Used  Substance Use Topics  . Alcohol use: No  . Drug use: No     Allergies   Codeine and Hydrocodone   Review of Systems Review of Systems  Constitutional: Negative for activity change, fatigue and fever.  Eyes: Negative for visual disturbance.  Respiratory: Negative for chest tightness and shortness of breath.   Cardiovascular: Negative for chest pain, palpitations and leg swelling.  Gastrointestinal: Negative.   Endocrine: Negative.   Genitourinary: Negative.   Musculoskeletal: Negative.   Skin: Negative.   Neurological: Positive for headaches. Negative for dizziness, weakness, light-headedness and numbness.     Physical Exam Updated Vital Signs BP (!) 156/96   Pulse (!) 54   Temp 98.4 F (36.9 C) (Oral)   Resp 20   Ht 5\' 5"  (1.651 m)   Wt 106.3 kg (234 lb 6 oz)   SpO2  99%   BMI 39.00 kg/m   Physical Exam  Constitutional: She appears well-developed and well-nourished. No distress.  Eyes: Pupils are equal, round, and reactive to light. Conjunctivae and EOM are normal.  Fundoscopic exam:      The right eye shows no arteriolar narrowing, no AV nicking and no papilledema.       The left eye shows no arteriolar narrowing, no AV nicking and no papilledema.  Cardiovascular: Normal rate, regular rhythm, normal heart sounds and intact distal pulses.  Pulses:      Dorsalis pedis pulses are 2+ on the right side, and 2+ on the left side.       Posterior tibial pulses are 2+ on the  right side, and 2+ on the left side.  Pulmonary/Chest: Effort normal and breath sounds normal.  Neurological: She is alert. She has normal strength. No sensory deficit. She exhibits normal muscle tone. Coordination and gait normal.  Skin: Skin is warm. Capillary refill takes less than 2 seconds.  Nursing note and vitals reviewed.    ED Treatments / Results  Labs (all labs ordered are listed, but only abnormal results are displayed) Labs Reviewed - No data to display  EKG None  Radiology No results found.  Procedures Procedures (including critical care time)  Medications Ordered in ED Medications - No data to display   Initial Impression / Assessment and Plan / ED Course  Triage vital signs and the nursing notes have been reviewed.  Pertinent labs & imaging results that were available during care of the patient were reviewed and considered in medical decision making (see chart for details).   Patient presents in no acute distress and is well appearing. BP upon arrival was elevated at 098/11, but systolic decreased to 914. Patient denies s/s that suggest an acute cardiac, pulmonary or neuro process. Other than the headache that she experienced earlier this afternoon which is most likely due to dehydration and being overheated, she has no physical complaints. Patient admits to being frequently stressed and anxious due to familial stress which could be contributing to elevated BP. No signs or history that is consistent with end organ damage that warrants further evaluation today. Patient is stable and well enough for discharge with outpatient follow-up. She states she has an appointment with her PCP in 2 weeks.  Final Clinical Impressions(s) / ED Diagnoses  1. Hypertension. Continue medications as prescribed PCP. Education on s/s that would warrant return to the ED vs PCP.  Dispo: Home. After thorough clinical evaluation, this patient is determined to be medically stable and can be  safely discharged with the previously mentioned treatment and/or outpatient follow-up/referral(s). At this time, there are no other apparent medical conditions that require further screening, evaluation or treatment.   Final diagnoses:  Essential hypertension    ED Discharge Orders    None        Junita Push 06/06/18 [place], [place], DO 06/06/18 [id number]

## 2018-07-24 DIAGNOSIS — I10 Essential (primary) hypertension: Secondary | ICD-10-CM | POA: Diagnosis not present

## 2018-07-24 DIAGNOSIS — R399 Unspecified symptoms and signs involving the genitourinary system: Secondary | ICD-10-CM | POA: Diagnosis not present

## 2018-07-24 DIAGNOSIS — R300 Dysuria: Secondary | ICD-10-CM | POA: Diagnosis not present

## 2018-07-24 DIAGNOSIS — E1169 Type 2 diabetes mellitus with other specified complication: Secondary | ICD-10-CM | POA: Diagnosis not present

## 2018-07-24 DIAGNOSIS — F419 Anxiety disorder, unspecified: Secondary | ICD-10-CM | POA: Diagnosis not present

## 2018-07-24 DIAGNOSIS — E7800 Pure hypercholesterolemia, unspecified: Secondary | ICD-10-CM | POA: Diagnosis not present

## 2018-08-29 DIAGNOSIS — E6609 Other obesity due to excess calories: Secondary | ICD-10-CM | POA: Diagnosis not present

## 2018-08-29 DIAGNOSIS — E119 Type 2 diabetes mellitus without complications: Secondary | ICD-10-CM | POA: Diagnosis not present

## 2018-08-29 DIAGNOSIS — I10 Essential (primary) hypertension: Secondary | ICD-10-CM | POA: Diagnosis not present

## 2018-08-29 DIAGNOSIS — F439 Reaction to severe stress, unspecified: Secondary | ICD-10-CM | POA: Diagnosis not present

## 2018-08-29 DIAGNOSIS — Z6838 Body mass index (BMI) 38.0-38.9, adult: Secondary | ICD-10-CM | POA: Diagnosis not present

## 2018-08-29 DIAGNOSIS — Z1239 Encounter for other screening for malignant neoplasm of breast: Secondary | ICD-10-CM | POA: Diagnosis not present

## 2018-08-29 DIAGNOSIS — Z1211 Encounter for screening for malignant neoplasm of colon: Secondary | ICD-10-CM | POA: Diagnosis not present

## 2018-09-08 DIAGNOSIS — N6002 Solitary cyst of left breast: Secondary | ICD-10-CM | POA: Diagnosis not present

## 2018-09-08 DIAGNOSIS — R921 Mammographic calcification found on diagnostic imaging of breast: Secondary | ICD-10-CM | POA: Diagnosis not present

## 2018-09-11 DIAGNOSIS — H9191 Unspecified hearing loss, right ear: Secondary | ICD-10-CM | POA: Diagnosis not present

## 2018-09-11 DIAGNOSIS — Z23 Encounter for immunization: Secondary | ICD-10-CM | POA: Diagnosis not present

## 2018-09-11 DIAGNOSIS — H6121 Impacted cerumen, right ear: Secondary | ICD-10-CM | POA: Diagnosis not present

## 2018-09-18 DIAGNOSIS — L989 Disorder of the skin and subcutaneous tissue, unspecified: Secondary | ICD-10-CM | POA: Diagnosis not present

## 2018-09-18 DIAGNOSIS — E1169 Type 2 diabetes mellitus with other specified complication: Secondary | ICD-10-CM | POA: Diagnosis not present

## 2018-09-18 DIAGNOSIS — I10 Essential (primary) hypertension: Secondary | ICD-10-CM | POA: Diagnosis not present

## 2018-09-21 ENCOUNTER — Other Ambulatory Visit: Payer: Self-pay | Admitting: Family Medicine

## 2018-09-21 DIAGNOSIS — L919 Hypertrophic disorder of the skin, unspecified: Secondary | ICD-10-CM | POA: Diagnosis not present

## 2018-09-25 DIAGNOSIS — L918 Other hypertrophic disorders of the skin: Secondary | ICD-10-CM | POA: Diagnosis not present

## 2018-09-25 DIAGNOSIS — L919 Hypertrophic disorder of the skin, unspecified: Secondary | ICD-10-CM | POA: Diagnosis not present

## 2018-09-25 DIAGNOSIS — D492 Neoplasm of unspecified behavior of bone, soft tissue, and skin: Secondary | ICD-10-CM | POA: Diagnosis not present

## 2019-01-04 DIAGNOSIS — I10 Essential (primary) hypertension: Secondary | ICD-10-CM | POA: Diagnosis not present

## 2019-01-04 DIAGNOSIS — H6123 Impacted cerumen, bilateral: Secondary | ICD-10-CM | POA: Diagnosis not present

## 2019-01-18 DIAGNOSIS — N949 Unspecified condition associated with female genital organs and menstrual cycle: Secondary | ICD-10-CM | POA: Diagnosis not present

## 2019-01-18 DIAGNOSIS — N760 Acute vaginitis: Secondary | ICD-10-CM | POA: Diagnosis not present

## 2019-03-01 DIAGNOSIS — R3981 Functional urinary incontinence: Secondary | ICD-10-CM | POA: Diagnosis not present

## 2019-03-01 DIAGNOSIS — N762 Acute vulvitis: Secondary | ICD-10-CM | POA: Diagnosis not present

## 2019-03-01 DIAGNOSIS — N898 Other specified noninflammatory disorders of vagina: Secondary | ICD-10-CM | POA: Diagnosis not present

## 2019-03-20 DIAGNOSIS — N898 Other specified noninflammatory disorders of vagina: Secondary | ICD-10-CM | POA: Diagnosis not present

## 2019-03-20 DIAGNOSIS — R3981 Functional urinary incontinence: Secondary | ICD-10-CM | POA: Diagnosis not present

## 2019-03-20 DIAGNOSIS — B373 Candidiasis of vulva and vagina: Secondary | ICD-10-CM | POA: Diagnosis not present

## 2019-03-20 DIAGNOSIS — R9389 Abnormal findings on diagnostic imaging of other specified body structures: Secondary | ICD-10-CM | POA: Diagnosis not present

## 2019-04-03 ENCOUNTER — Other Ambulatory Visit: Payer: Self-pay | Admitting: Nurse Practitioner

## 2019-04-03 DIAGNOSIS — N840 Polyp of corpus uteri: Secondary | ICD-10-CM | POA: Diagnosis not present

## 2019-04-03 DIAGNOSIS — R9389 Abnormal findings on diagnostic imaging of other specified body structures: Secondary | ICD-10-CM | POA: Diagnosis not present

## 2019-04-12 DIAGNOSIS — N840 Polyp of corpus uteri: Secondary | ICD-10-CM | POA: Diagnosis not present

## 2019-04-12 DIAGNOSIS — R9389 Abnormal findings on diagnostic imaging of other specified body structures: Secondary | ICD-10-CM | POA: Diagnosis not present

## 2019-04-12 DIAGNOSIS — Z780 Asymptomatic menopausal state: Secondary | ICD-10-CM | POA: Diagnosis not present

## 2019-05-17 DIAGNOSIS — E1169 Type 2 diabetes mellitus with other specified complication: Secondary | ICD-10-CM | POA: Diagnosis not present

## 2019-05-17 DIAGNOSIS — I10 Essential (primary) hypertension: Secondary | ICD-10-CM | POA: Diagnosis not present

## 2019-05-17 DIAGNOSIS — Z6841 Body Mass Index (BMI) 40.0 and over, adult: Secondary | ICD-10-CM | POA: Diagnosis not present

## 2019-05-17 DIAGNOSIS — E6609 Other obesity due to excess calories: Secondary | ICD-10-CM | POA: Diagnosis not present

## 2019-05-17 DIAGNOSIS — Z01810 Encounter for preprocedural cardiovascular examination: Secondary | ICD-10-CM | POA: Diagnosis not present

## 2019-05-28 ENCOUNTER — Encounter (HOSPITAL_BASED_OUTPATIENT_CLINIC_OR_DEPARTMENT_OTHER): Payer: Self-pay | Admitting: *Deleted

## 2019-05-28 ENCOUNTER — Other Ambulatory Visit: Payer: Self-pay

## 2019-05-28 DIAGNOSIS — Z01818 Encounter for other preprocedural examination: Secondary | ICD-10-CM | POA: Diagnosis not present

## 2019-05-28 DIAGNOSIS — N9089 Other specified noninflammatory disorders of vulva and perineum: Secondary | ICD-10-CM | POA: Diagnosis not present

## 2019-05-28 DIAGNOSIS — N898 Other specified noninflammatory disorders of vagina: Secondary | ICD-10-CM | POA: Diagnosis not present

## 2019-05-28 DIAGNOSIS — N840 Polyp of corpus uteri: Secondary | ICD-10-CM | POA: Diagnosis not present

## 2019-05-31 ENCOUNTER — Encounter (HOSPITAL_BASED_OUTPATIENT_CLINIC_OR_DEPARTMENT_OTHER)
Admission: RE | Admit: 2019-05-31 | Discharge: 2019-05-31 | Disposition: A | Discharge status: Home / Self Care | Payer: Medicare HMO | Source: Ambulatory Visit | Attending: Obstetrics & Gynecology | Admitting: Obstetrics & Gynecology

## 2019-05-31 ENCOUNTER — Other Ambulatory Visit: Payer: Self-pay

## 2019-05-31 ENCOUNTER — Other Ambulatory Visit (HOSPITAL_COMMUNITY)
Admission: RE | Admit: 2019-05-31 | Discharge: 2019-05-31 | Disposition: A | Discharge status: Home / Self Care | Payer: Medicare HMO | Source: Ambulatory Visit | Attending: Obstetrics & Gynecology | Admitting: Obstetrics & Gynecology

## 2019-05-31 DIAGNOSIS — Z885 Allergy status to narcotic agent status: Secondary | ICD-10-CM | POA: Diagnosis not present

## 2019-05-31 DIAGNOSIS — Z791 Long term (current) use of non-steroidal anti-inflammatories (NSAID): Secondary | ICD-10-CM | POA: Diagnosis not present

## 2019-05-31 DIAGNOSIS — K219 Gastro-esophageal reflux disease without esophagitis: Secondary | ICD-10-CM | POA: Diagnosis not present

## 2019-05-31 DIAGNOSIS — Z818 Family history of other mental and behavioral disorders: Secondary | ICD-10-CM | POA: Diagnosis not present

## 2019-05-31 DIAGNOSIS — E119 Type 2 diabetes mellitus without complications: Secondary | ICD-10-CM | POA: Diagnosis not present

## 2019-05-31 DIAGNOSIS — E785 Hyperlipidemia, unspecified: Secondary | ICD-10-CM | POA: Diagnosis not present

## 2019-05-31 DIAGNOSIS — Z6841 Body Mass Index (BMI) 40.0 and over, adult: Secondary | ICD-10-CM | POA: Diagnosis not present

## 2019-05-31 DIAGNOSIS — Z8249 Family history of ischemic heart disease and other diseases of the circulatory system: Secondary | ICD-10-CM | POA: Diagnosis not present

## 2019-05-31 DIAGNOSIS — G43909 Migraine, unspecified, not intractable, without status migrainosus: Secondary | ICD-10-CM | POA: Diagnosis not present

## 2019-05-31 DIAGNOSIS — Z87891 Personal history of nicotine dependence: Secondary | ICD-10-CM | POA: Diagnosis not present

## 2019-05-31 DIAGNOSIS — Z833 Family history of diabetes mellitus: Secondary | ICD-10-CM | POA: Diagnosis not present

## 2019-05-31 DIAGNOSIS — N854 Malposition of uterus: Secondary | ICD-10-CM | POA: Diagnosis not present

## 2019-05-31 DIAGNOSIS — I10 Essential (primary) hypertension: Secondary | ICD-10-CM | POA: Diagnosis not present

## 2019-05-31 DIAGNOSIS — R9389 Abnormal findings on diagnostic imaging of other specified body structures: Secondary | ICD-10-CM | POA: Diagnosis present

## 2019-05-31 DIAGNOSIS — Z1159 Encounter for screening for other viral diseases: Secondary | ICD-10-CM | POA: Diagnosis not present

## 2019-05-31 DIAGNOSIS — N840 Polyp of corpus uteri: Secondary | ICD-10-CM | POA: Diagnosis not present

## 2019-05-31 DIAGNOSIS — Z79899 Other long term (current) drug therapy: Secondary | ICD-10-CM | POA: Diagnosis not present

## 2019-05-31 DIAGNOSIS — Z800 Family history of malignant neoplasm of digestive organs: Secondary | ICD-10-CM | POA: Diagnosis not present

## 2019-05-31 DIAGNOSIS — L918 Other hypertrophic disorders of the skin: Secondary | ICD-10-CM | POA: Diagnosis not present

## 2019-05-31 DIAGNOSIS — Z880 Allergy status to penicillin: Secondary | ICD-10-CM | POA: Diagnosis not present

## 2019-05-31 DIAGNOSIS — J45909 Unspecified asthma, uncomplicated: Secondary | ICD-10-CM | POA: Diagnosis not present

## 2019-05-31 LAB — CBC
HCT: 38.7 % (ref 36.0–46.0)
Hemoglobin: 12.4 g/dL (ref 12.0–15.0)
MCH: 28.2 pg (ref 26.0–34.0)
MCHC: 32 g/dL (ref 30.0–36.0)
MCV: 88 fL (ref 80.0–100.0)
Platelets: 285 10*3/uL (ref 150–400)
RBC: 4.4 MIL/uL (ref 3.87–5.11)
RDW: 14.1 % (ref 11.5–15.5)
WBC: 7.1 10*3/uL (ref 4.0–10.5)
nRBC: 0 % (ref 0.0–0.2)

## 2019-05-31 LAB — SARS CORONAVIRUS 2 (TAT 6-24 HRS): SARS Coronavirus 2: NEGATIVE

## 2019-05-31 NOTE — Progress Notes (Signed)
Patient given pre-surgery drink with instructions to drink at 0430 the DOS.  Patient verbalized understanding.

## 2019-06-03 NOTE — H&P (Signed)
68yo PM female who presents for hysteroscopy, D&C, polypectomy. -Uterine polyp: Pt was noted to have an incidental thickened endometrial lining- SHG was completed on 04/03/19 that showed 8cm anteverted uterus with thickened lining 1.7cm- following saline- hyperechoic 2.7cm mass with cystic areas. Normal ovaries bilaterally. EMB also completed- polypoid endometrium- benign    Current Medications  Taking   Carvedilol 12.5 MG Tablet one tab Orally twice a day   Amlodipine Besylate 2.5 MG Tablet 1 tablet Orally Once a day   Olmesartan Medoxomil-HCTZ 40-25 MG Tablet 1 tablet Orally Once a day   Ventolin HFA(Albuterol Sulfate HFA) 90 MCG/ACT Aerosol Solution 2 puffs as needed Inhalation every 6 hrs   Accu-Chek Aviva Plus test strips . Test Strips Use to check blood sugars Finger Stick as directed   Accu-Chek Softclix Lancets - Miscellaneous as directed In-Vitro use to test blood sugars daily/DX E11.9   Accu-Chek Aviva(Blood Glucose Test) - Solution as directed In Vitro use with Accu-chek aviva plus monitor   Ondansetron HCl 4 MG Tablet 1 tablet Orally twice a day as needed for nausea   Rosuvastatin Calcium 5 MG Tablet 1 tablet Orally Once a day   Accu-Chek Softclix Lancets - Miscellaneous as directed   Accu-Chek Aviva Test Strips as directed twice a day   Escitalopram Oxalate 5 MG Tablet 1 tablet Orally Once a day   Alprazolam 0.5 MG Tablet 1/4 tablet Orally every 8 hours for anxiety as needed   Accu-Chek Aviva Plus(Blood Glucose Test) w/Device Kit as directed In Vitro use to test blood sugars daily/ DX E11.9   Accu-Chek Aviva Plus(Blood Glucose Test) - Strip as directed In Vitro use to test blood sugars daily/DX E11.9   Not-Taking   Diflucan(Fluconazole) 150 MG Tablet 1 tablet Orally Once. Repeat in 3 days.   Cytotec(miSOPROStol) 200 MCG Tablet 1 tablet with meals and at bedtime Orally Four times a day   Nystatin 100000 UNIT/GM Ointment 1 thin application Externally Twice a day   Aspirin 81 MG  Tablet Chewable 1 tablet Orally once a day if needed   BD Swab Single Use Regular - Pad as directed topical use when testing blood sugars   Calcium + Vit D(Dexbrompheniramine-PSE ER) , Notes: occ   Fish Oil 1000 MG Capsule 1 capsule Orally Once a day   ibuprofen 1 tab Oral as needed   Supplement: Cranberry Fruit '4200mg'$  1 capsule by mouth daily   Vitamin E 400 UNIT Capsule 1 capsule Orally Once a day   Medication List reviewed and reconciled with the patient    Past Medical History  Asthma.   Fibrocystic breast disease.   Migraine headaches.   Overweight.   hypertension/[facility].   H/o concussion 03/2011.   Hyperlipidemia.   pap done 04/12/2014 [facility] in [place], normal report.   DEXA 04/14/2017 T score hip -.7 and spine +.5 [place] .   Diabetes dx 09/2017.           Surgical History  foot right trauma to the right ankle , compounded fx 2006   Family History  Father: deceased 51 yrs, MVA, CAD, diagnosed with Coronary artery disease  Mother: deceased 65 yrs, MVA, HTN, Diabetes  Paternal [place] Father: deceased  Paternal Grand Mother: deceased, CHF, HTN  Maternal Grand Father: deceased  Maternal Grand Mother: deceased  Brother 1: alive, DM and CAD with MI, recovered alcoholic, Diabetes, CVA, Coronary artery disease  Brother2: alive, paraplegic, Diabetes  Brother 3: alive, estranged from the family  Sister 1: alive, uterine  cancer, hysterectomy  Sister 2: alive, healthy  Sister 3: alive, colon cancer dx 50, Colon cancer  3 brother(s) , 4 sister(s) . 2 son(s) , 3 daughter(s) - healthy.   4th sister, healthy, alive oldest daughter bipolar.   Social History  General:  no EXPOSURE TO PASSIVE SMOKE, quit in her 30's.  Alcohol: no.  DIET: no particular dietary program.  EDUCATION: High School.  Seat belt use: always.  Tobacco Exposure: secondhand smoke in clubs, husband played in a band.  DENTAL CARE: once a year.  no Recreational drug use.  Exercise: walking  3-4 x per week.  Children: 5 children and 15 grandchildren 3 great grandchild.  Caffeine: 1 serving daily.  COMMUNICATION BARRIERS: no hearing, vision or cognition issues.  Tobacco use  cigarettes: Former smoker Quit in year 1970 Pack-year Hx: 2 Tobacco history last updated 05/28/2019 Vaping No Religion: Catholic.  Marital Status: Married, Mikki Santee 1970.  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    Gyn History  Sexual activity currently sexually active, not often.  Periods : postmenopausal - in her 35s.  Last pap smear date 11/08/17 - WNL/HPV not done.  Last mammogram date 09/08/18 - 3D.  Denies H/O Abnormal pap smear.  Denies H/O STD.    OB History  Number of pregnancies 8.  Pregnancy # 1 live birth, girl, vaginal delivery.  Pregnancy # 2 live birth, boy, vaginal delivery.  Pregnancy # 3 miscarriage.  Pregnancy # 4: miscarriage.  Pregnancy # 5: Miscarriage.  Pregnancy # 6 live birth, girl, vaginal delivery.  Pregnancy # 7 girl, vaginal delivery live birth.  Pregnancy # 8 live birth, vaginal delivery boy.    Allergies  Codeine Sulfate: loopy - Side Effects  Amoxicillin: hives - Allergy  Amlodipine Besylate: swelling (ok with 2.5) - Side Effects   Hospitalization/Major Diagnostic Procedure  childbirth x 5 NVD   trauma to right ankle/foot. surgery. 2006  placenta previa with one pregnancy and blood transfusions.    Review of Systems  CONSTITUTIONAL:  no Chills. Fatigue yes. no Fever. no Night sweats. Weight change yes.  HEENT:  Blurrred vision no. no Double vision.  CARDIOLOGY:  no Chest pain.  RESPIRATORY:  no Shortness of breath. no Cough.  UROLOGY:  no Urinary frequency. no Urinary incontinence. no Urinary urgency.  GASTROENTEROLOGY:  no Abdominal pain. no Appetite change. no Change in bowel movements.  FEMALE REPRODUCTIVE:  no Breast lumps or discharge. no Breast pain.  NEUROLOGY:  no Dizziness. no Headache. no Loss of  consciousness.  PSYCHOLOGY:  Anxiety yes. no Depression.  SKIN:  no Rash. no Hives.  HEMATOLOGY/LYMPH:  no Anemia. Using Blood Thinners no.     Vital Signs  Wt 262, Wt change 4.4 lb, Ht 64.5, BMI 44.27, Temp 97.4, Pulse sitting 70, BP sitting 138/92.   Examination  General Examination: CONSTITUTIONAL: well developed, well nourished.  SKIN: warm and dry, no rashes.  NECK: supple, normal appearance.  LUNGS: clear to auscultation bilaterally, no wheezes, rhonchi, rales.  HEART: no murmurs, regular rate and rhythm.  ABDOMEN: obese, soft and non-tender, no rebound, no guarding.  FEMALE GENITOURINARY: normal external genitalia- skin tag noted on inner right thigh, labia - unremarkable, vagina - pink moist mucosa, no lesions or abnormal discharge, cervix - no discharge or lesions or CMT, adnexa - no masses or tenderness, uterus - nontender and normal size on palpation.  MUSCULOSKELETAL no calf tenderness bilaterally.  EXTREMITIES: no edema present.  PSYCH: appropriate mood and affect.  A/P: 68yo PM female who presents for hysteroscopy, D&C, myosure polypectomy and skin tag removal -NPO -LR @ 125cc/hr -SCDs to OR -Risk/benefit and alternatives reviewed with patient including risk of bleeding, infection and potential uterine perforation.  Question and concerns were addressed and she desires to proceed  Janyth Pupa, DO [phone] (cell) [phone] (office)

## 2019-06-04 ENCOUNTER — Ambulatory Visit (HOSPITAL_BASED_OUTPATIENT_CLINIC_OR_DEPARTMENT_OTHER): Payer: Medicare HMO | Admitting: Anesthesiology

## 2019-06-04 ENCOUNTER — Ambulatory Visit (HOSPITAL_BASED_OUTPATIENT_CLINIC_OR_DEPARTMENT_OTHER)
Admission: RE | Admit: 2019-06-04 | Discharge: 2019-06-04 | Disposition: A | Discharge status: Home / Self Care | Payer: Medicare HMO | Attending: Obstetrics & Gynecology | Admitting: Obstetrics & Gynecology

## 2019-06-04 ENCOUNTER — Encounter (HOSPITAL_BASED_OUTPATIENT_CLINIC_OR_DEPARTMENT_OTHER)
Admission: RE | Disposition: A | Discharge status: Home / Self Care | Payer: Self-pay | Source: Home / Self Care | Attending: Obstetrics & Gynecology

## 2019-06-04 ENCOUNTER — Other Ambulatory Visit: Payer: Self-pay

## 2019-06-04 ENCOUNTER — Encounter (HOSPITAL_BASED_OUTPATIENT_CLINIC_OR_DEPARTMENT_OTHER): Payer: Self-pay

## 2019-06-04 DIAGNOSIS — Z780 Asymptomatic menopausal state: Secondary | ICD-10-CM | POA: Diagnosis not present

## 2019-06-04 DIAGNOSIS — E119 Type 2 diabetes mellitus without complications: Secondary | ICD-10-CM | POA: Diagnosis not present

## 2019-06-04 DIAGNOSIS — Z6841 Body Mass Index (BMI) 40.0 and over, adult: Secondary | ICD-10-CM | POA: Insufficient documentation

## 2019-06-04 DIAGNOSIS — R9389 Abnormal findings on diagnostic imaging of other specified body structures: Secondary | ICD-10-CM | POA: Diagnosis not present

## 2019-06-04 DIAGNOSIS — I10 Essential (primary) hypertension: Secondary | ICD-10-CM | POA: Insufficient documentation

## 2019-06-04 DIAGNOSIS — Z880 Allergy status to penicillin: Secondary | ICD-10-CM | POA: Insufficient documentation

## 2019-06-04 DIAGNOSIS — L918 Other hypertrophic disorders of the skin: Secondary | ICD-10-CM | POA: Diagnosis not present

## 2019-06-04 DIAGNOSIS — Z833 Family history of diabetes mellitus: Secondary | ICD-10-CM | POA: Insufficient documentation

## 2019-06-04 DIAGNOSIS — E785 Hyperlipidemia, unspecified: Secondary | ICD-10-CM | POA: Insufficient documentation

## 2019-06-04 DIAGNOSIS — J45909 Unspecified asthma, uncomplicated: Secondary | ICD-10-CM | POA: Insufficient documentation

## 2019-06-04 DIAGNOSIS — Z818 Family history of other mental and behavioral disorders: Secondary | ICD-10-CM | POA: Insufficient documentation

## 2019-06-04 DIAGNOSIS — N840 Polyp of corpus uteri: Secondary | ICD-10-CM

## 2019-06-04 DIAGNOSIS — N854 Malposition of uterus: Secondary | ICD-10-CM | POA: Insufficient documentation

## 2019-06-04 DIAGNOSIS — Z87891 Personal history of nicotine dependence: Secondary | ICD-10-CM | POA: Insufficient documentation

## 2019-06-04 DIAGNOSIS — Z885 Allergy status to narcotic agent status: Secondary | ICD-10-CM | POA: Insufficient documentation

## 2019-06-04 DIAGNOSIS — G43909 Migraine, unspecified, not intractable, without status migrainosus: Secondary | ICD-10-CM | POA: Insufficient documentation

## 2019-06-04 DIAGNOSIS — Z79899 Other long term (current) drug therapy: Secondary | ICD-10-CM | POA: Insufficient documentation

## 2019-06-04 DIAGNOSIS — Z791 Long term (current) use of non-steroidal anti-inflammatories (NSAID): Secondary | ICD-10-CM | POA: Insufficient documentation

## 2019-06-04 DIAGNOSIS — Z800 Family history of malignant neoplasm of digestive organs: Secondary | ICD-10-CM | POA: Insufficient documentation

## 2019-06-04 DIAGNOSIS — K219 Gastro-esophageal reflux disease without esophagitis: Secondary | ICD-10-CM | POA: Insufficient documentation

## 2019-06-04 DIAGNOSIS — Z8249 Family history of ischemic heart disease and other diseases of the circulatory system: Secondary | ICD-10-CM | POA: Insufficient documentation

## 2019-06-04 HISTORY — DX: Depression, unspecified: F32.A

## 2019-06-04 HISTORY — DX: Other allergic rhinitis: J30.89

## 2019-06-04 HISTORY — PX: EXCISION OF SKIN TAG: SHX6270

## 2019-06-04 HISTORY — DX: Anxiety disorder, unspecified: F41.9

## 2019-06-04 HISTORY — DX: Polyp of corpus uteri: N84.0

## 2019-06-04 HISTORY — PX: DILATATION & CURETTAGE/HYSTEROSCOPY WITH MYOSURE: SHX6511

## 2019-06-04 SURGERY — DILATATION & CURETTAGE/HYSTEROSCOPY WITH MYOSURE
Anesthesia: General | Site: Thigh | Laterality: Right

## 2019-06-04 MED ORDER — DEXAMETHASONE SODIUM PHOSPHATE 10 MG/ML IJ SOLN
INTRAMUSCULAR | Status: AC
  Filled 2019-06-04: qty 1

## 2019-06-04 MED ORDER — FENTANYL CITRATE (PF) 100 MCG/2ML IJ SOLN: 50.0000 ug | INTRAMUSCULAR | Status: DC | PRN

## 2019-06-04 MED ORDER — LIDOCAINE-EPINEPHRINE (PF) 1 %-1:200000 IJ SOLN
INTRAMUSCULAR | Status: AC
  Filled 2019-06-04: qty 30

## 2019-06-04 MED ORDER — LIDOCAINE 2% (20 MG/ML) 5 ML SYRINGE
INTRAMUSCULAR | Status: DC | PRN
  Administered 2019-06-04: 50 mg via INTRAVENOUS

## 2019-06-04 MED ORDER — MIDAZOLAM HCL 2 MG/2ML IJ SOLN: 1.0000 mg | INTRAMUSCULAR | Status: DC | PRN

## 2019-06-04 MED ORDER — MIDAZOLAM HCL 5 MG/5ML IJ SOLN
INTRAMUSCULAR | Status: DC | PRN
  Administered 2019-06-04: 2 mg via INTRAVENOUS

## 2019-06-04 MED ORDER — BUPIVACAINE HCL (PF) 0.5 % IJ SOLN
INTRAMUSCULAR | Status: AC
  Filled 2019-06-04: qty 30

## 2019-06-04 MED ORDER — ONDANSETRON HCL 4 MG/2ML IJ SOLN: 4.0000 mg | Freq: Once | INTRAMUSCULAR | Status: DC | PRN

## 2019-06-04 MED ORDER — ONDANSETRON HCL 4 MG/2ML IJ SOLN
INTRAMUSCULAR | Status: DC | PRN
  Administered 2019-06-04: 4 mg via INTRAVENOUS

## 2019-06-04 MED ORDER — LIDOCAINE-EPINEPHRINE 1 %-1:100000 IJ SOLN
INTRAMUSCULAR | Status: AC
  Filled 2019-06-04: qty 1

## 2019-06-04 MED ORDER — GLYCOPYRROLATE 0.2 MG/ML IJ SOLN
INTRAMUSCULAR | Status: DC | PRN
  Administered 2019-06-04: 0.2 mg via INTRAVENOUS

## 2019-06-04 MED ORDER — SILVER NITRATE-POT NITRATE 75-25 % EX MISC
CUTANEOUS | Status: AC
  Filled 2019-06-04: qty 1

## 2019-06-04 MED ORDER — GLYCOPYRROLATE PF 0.2 MG/ML IJ SOSY
PREFILLED_SYRINGE | INTRAMUSCULAR | Status: AC
  Filled 2019-06-04: qty 2

## 2019-06-04 MED ORDER — SCOPOLAMINE 1 MG/3DAYS TD PT72: 1.0000 | MEDICATED_PATCH | Freq: Once | TRANSDERMAL | Status: DC

## 2019-06-04 MED ORDER — PROPOFOL 10 MG/ML IV BOLUS
INTRAVENOUS | Status: AC
  Filled 2019-06-04: qty 20

## 2019-06-04 MED ORDER — LIDOCAINE 2% (20 MG/ML) 5 ML SYRINGE
INTRAMUSCULAR | Status: AC
  Filled 2019-06-04: qty 5

## 2019-06-04 MED ORDER — LIDOCAINE-EPINEPHRINE 1 %-1:100000 IJ SOLN
INTRAMUSCULAR | Status: DC | PRN
  Administered 2019-06-04: 10 mL

## 2019-06-04 MED ORDER — EPHEDRINE SULFATE 50 MG/ML IJ SOLN
INTRAMUSCULAR | Status: DC | PRN
  Administered 2019-06-04: 10 mg via INTRAVENOUS

## 2019-06-04 MED ORDER — ATROPINE SULFATE 0.4 MG/ML IJ SOLN
INTRAMUSCULAR | Status: DC | PRN
  Administered 2019-06-04: 0.4 mg via INTRAVENOUS

## 2019-06-04 MED ORDER — LACTATED RINGERS IV SOLN
INTRAVENOUS | Status: DC
  Administered 2019-06-04 (×2): via INTRAVENOUS

## 2019-06-04 MED ORDER — LACTATED RINGERS IV SOLN: INTRAVENOUS | Status: DC

## 2019-06-04 MED ORDER — SODIUM CHLORIDE 0.9 % IR SOLN
Status: DC | PRN
  Administered 2019-06-04: 900 mL

## 2019-06-04 MED ORDER — ONDANSETRON HCL 4 MG/2ML IJ SOLN
INTRAMUSCULAR | Status: AC
  Filled 2019-06-04: qty 2

## 2019-06-04 MED ORDER — PROPOFOL 10 MG/ML IV BOLUS
INTRAVENOUS | Status: DC | PRN
  Administered 2019-06-04: 50 mg via INTRAVENOUS

## 2019-06-04 MED ORDER — DEXAMETHASONE SODIUM PHOSPHATE 4 MG/ML IJ SOLN
INTRAMUSCULAR | Status: DC | PRN
  Administered 2019-06-04: 10 mg via INTRAVENOUS

## 2019-06-04 MED ORDER — MIDAZOLAM HCL 2 MG/2ML IJ SOLN
INTRAMUSCULAR | Status: AC
  Filled 2019-06-04: qty 2

## 2019-06-04 MED ORDER — FENTANYL CITRATE (PF) 100 MCG/2ML IJ SOLN
INTRAMUSCULAR | Status: DC | PRN
  Administered 2019-06-04: 100 ug via INTRAVENOUS

## 2019-06-04 MED ORDER — BUPIVACAINE HCL (PF) 0.25 % IJ SOLN
INTRAMUSCULAR | Status: AC
  Filled 2019-06-04: qty 30

## 2019-06-04 MED ORDER — FENTANYL CITRATE (PF) 100 MCG/2ML IJ SOLN
INTRAMUSCULAR | Status: AC
  Filled 2019-06-04: qty 2

## 2019-06-04 MED ORDER — SILVER NITRATE-POT NITRATE 75-25 % EX MISC
CUTANEOUS | Status: DC | PRN
  Administered 2019-06-04: 1

## 2019-06-04 MED ORDER — EPHEDRINE 5 MG/ML INJ
INTRAVENOUS | Status: AC
  Filled 2019-06-04: qty 20

## 2019-06-04 MED ORDER — FENTANYL CITRATE (PF) 100 MCG/2ML IJ SOLN: 25.0000 ug | INTRAMUSCULAR | Status: DC | PRN

## 2019-06-04 SURGICAL SUPPLY — 19 items
BRIEF STRETCH FOR OB PAD XXL (UNDERPADS AND DIAPERS) ×3 IMPLANT
CANISTER SUCT 3000ML PPV (MISCELLANEOUS) ×3 IMPLANT
CATH ROBINSON RED A/P 16FR (CATHETERS) ×2 IMPLANT
DEVICE MYOSURE LITE (MISCELLANEOUS) ×1 IMPLANT
DEVICE MYOSURE REACH (MISCELLANEOUS) IMPLANT
DILATOR CANAL MILEX (MISCELLANEOUS) IMPLANT
GLOVE BIOGEL PI IND STRL 6.5 (GLOVE) ×2 IMPLANT
GLOVE BIOGEL PI IND STRL 7.0 (GLOVE) ×2 IMPLANT
GLOVE BIOGEL PI INDICATOR 6.5 (GLOVE) ×1
GLOVE BIOGEL PI INDICATOR 7.0 (GLOVE) ×1
GLOVE ECLIPSE 6.5 STRL STRAW (GLOVE) ×2 IMPLANT
GOWN STRL REUS W/TWL LRG LVL3 (GOWN DISPOSABLE) ×6 IMPLANT
KIT PROCEDURE FLUENT (KITS) ×3 IMPLANT
PACK VAGINAL MINOR WOMEN LF (CUSTOM PROCEDURE TRAY) ×3 IMPLANT
PAD OB MATERNITY 4.3X12.25 (PERSONAL CARE ITEMS) ×3 IMPLANT
PAD PREP 24X48 CUFFED NSTRL (MISCELLANEOUS) ×3 IMPLANT
SEAL ROD LENS SCOPE MYOSURE (ABLATOR) ×3 IMPLANT
SLEEVE SCD COMPRESS KNEE MED (MISCELLANEOUS) ×3 IMPLANT
TOWEL GREEN STERILE FF (TOWEL DISPOSABLE) ×6 IMPLANT

## 2019-06-04 NOTE — Anesthesia Procedure Notes (Signed)
Procedure Name: LMA Insertion Date/Time: 06/04/2019 7:33 AM Performed by: Marrianne Mood, CRNA Pre-anesthesia Checklist: Patient identified, Emergency Drugs available, Suction available, Patient being monitored and Timeout performed Patient Re-evaluated:Patient Re-evaluated prior to induction Oxygen Delivery Method: Circle system utilized Preoxygenation: Pre-oxygenation with 100% oxygen Induction Type: IV induction Ventilation: Mask ventilation without difficulty LMA: LMA inserted LMA Size: 4.0 Number of attempts: 1 Airway Equipment and Method: Bite block Placement Confirmation: positive ETCO2 Tube secured with: Tape Dental Injury: Teeth and Oropharynx as per pre-operative assessment

## 2019-06-04 NOTE — Interval H&P Note (Signed)
History and Physical Interval Note:  06/04/2019 7:10 AM  Suzanne Patterson  has presented today for surgery, with the diagnosis of N84.0 uterine polyp R93.89 thickened endometrium Z78.0 postmenopausal.  The various methods of treatment have been discussed with the patient and family. After consideration of risks, benefits and other options for treatment, the patient has consented to  Procedure(s) with comments: [place] (N/A) - [place] Myosure Rep will be here.  Confirmed on 05/29/19 CS as a surgical intervention.  The patient's history has been reviewed, patient examined, no change in status, stable for surgery.  I have reviewed the patient's chart and labs.  Questions were answered to the patient's satisfaction.     Suzanne Patterson

## 2019-06-04 NOTE — Discharge Instructions (Addendum)
HOME INSTRUCTIONS  Please note any unusual or excessive bleeding, pain, swelling. Mild dizziness or drowsiness are normal for about 24 hours after surgery.   Shower when comfortable  Restrictions: No driving for 24 hours or while taking pain medications.  Activity:  No heavy lifting (> 10 lbs), nothing in vagina (no tampons, douching, or intercourse) x 2 weeks; no tub baths for 2 weeks Vaginal spotting is expected but if your bleeding is heavy, period like,  please call the office    Diet:  You may return to your regular diet.  Do not eat large meals.  Eat small frequent meals throughout the day.  Continue to drink a good amount of water at least 6-8 glasses of water per day, hydration is very important for the healing process.  Pain Management: Take Motrin and/or tylenol over the counter as needed.   Alcohol -- Avoid for 24 hours and while taking pain medications.  Nausea: Take sips of ginger ale or soda  Fever -- Call physician if temperature over 101 degrees  Follow up:  If you do not already have a follow up appointment scheduled, please call the office at [phone].  If you experience fever (a temperature greater than 100.4), pain unrelieved by pain medication, shortness of breath, swelling of a single leg, or any other symptoms which are concerning to you please the office immediately.        Post Anesthesia Home Care Instructions  Activity: Get plenty of rest for the remainder of the day. A responsible individual must stay with you for 24 hours following the procedure.  For the next 24 hours, DO NOT: -Drive a car -Paediatric nurse -Drink alcoholic beverages -Take any medication unless instructed by your physician -Make any legal decisions or sign important papers.  Meals: Start with liquid foods such as gelatin or soup. Progress to regular foods as tolerated. Avoid greasy, spicy, heavy foods. If nausea and/or vomiting occur, drink only clear liquids until the  nausea and/or vomiting subsides. Call your physician if vomiting continues.  Special Instructions/Symptoms: Your throat may feel dry or sore from the anesthesia or the breathing tube placed in your throat during surgery. If this causes discomfort, gargle with warm salt water. The discomfort should disappear within 24 hours.  If you had a scopolamine patch placed behind your ear for the management of post- operative nausea and/or vomiting:  1. The medication in the patch is effective for 72 hours, after which it should be removed.  Wrap patch in a tissue and discard in the trash. Wash hands thoroughly with soap and water. 2. You may remove the patch earlier than 72 hours if you experience unpleasant side effects which may include dry mouth, dizziness or visual disturbances. 3. Avoid touching the patch. Wash your hands with soap and water after contact with the patch.

## 2019-06-04 NOTE — Op Note (Signed)
Operative Report  PreOp: Thickened endometrium, uterine polyp, skin tag PostOp: same Procedure:  Hysteroscopy, Dilation and Curettage, Endometrial ablation, skin tag removal Surgeon: Dr. Janyth Pupa Anesthesia: General, cervical block Complications:none EBL: 86LR IVF:1200 Discrepancy: 200cc  Findings: 8cm anteverted uterus with polypoid-like endometrium, multiple small polyps noted  Specimens: 1) endometrial curettings with polyp  Procedure: The patient was taken to the operating room where she underwent genearal anesthesia without difficulty. The patient was placed in a low lithotomy position using Allen stirrups.  She was prepped and draped in the normal sterile fashion. Scissors were used to remove the skin tag- no bleeding was noted.  A sterile speculum was inserted into the vagina. 1% Lidocaine with epinephrine was injected into the cervix 20cc total.  A single tooth tenaculum was placed on the anterior lip of the cervix. The uterus was then sounded to 8cm. The endocervical canal was then serially dilated to 14French using Hank dilators.  The diagnostic hysteroscope was then inserted without difficulty and noted to have the findings as listed above. Visualization was achieved using NS as a distending medium. The myosure was inserted and resection was completed under direct visualization.  The hysteroscope was removed and sharp curettage was performed. The tissue was sent to pathology. The hysteroscope was reinserted, there was no evidence of uterine perforation. All instrument were then removed. Hemostasis was observed at the cervical site using silver nitrazine. The patient was repositioned to the supine position. The patient tolerated the procedure without any complications and taken to recovery in stable condition.   Janyth Pupa, DO [phone] (pager) [phone] (office)

## 2019-06-04 NOTE — Transfer of Care (Signed)
Immediate Anesthesia Transfer of Care Note  Patient: Suzanne Patterson  Procedure(s) Performed: DILATATION & CURETTAGE/HYSTEROSCOPY WITH MYOSURE (N/A ) EXCISION OF SKIN TAG (Right Thigh)  Patient Location: PACU  Anesthesia Type:General  Level of Consciousness: awake and patient cooperative  Airway & Oxygen Therapy: Patient Spontanous Breathing and Patient connected to face mask oxygen  Post-op Assessment: Report given to RN and Post -op Vital signs reviewed and stable  Post vital signs: Reviewed and stable  Last Vitals:  Vitals Value Taken Time  BP    Temp    Pulse    Resp    SpO2      Last Pain:  Vitals:   06/04/19 0645  TempSrc: Oral  PainSc: 0-No pain         Complications: No apparent anesthesia complications

## 2019-06-04 NOTE — Anesthesia Postprocedure Evaluation (Signed)
Anesthesia Post Note  Patient: Suzanne Patterson  Procedure(s) Performed: DILATATION & CURETTAGE/HYSTEROSCOPY WITH MYOSURE (N/A ) EXCISION OF SKIN TAG (Right Thigh)     Patient location during evaluation: PACU Anesthesia Type: General Level of consciousness: awake and alert Pain management: pain level controlled Vital Signs Assessment: post-procedure vital signs reviewed and stable Respiratory status: spontaneous breathing, nonlabored ventilation, respiratory function stable and patient connected to nasal cannula oxygen Cardiovascular status: blood pressure returned to baseline and stable Postop Assessment: no apparent nausea or vomiting Anesthetic complications: no    Last Vitals:  Vitals:   06/04/19 0900 06/04/19 1000  BP: (!) 157/83 (!) 150/73  Pulse: 69 64  Resp: 13 20  Temp:  36.6 C  SpO2: 98% 97%    Last Pain:  Vitals:   06/04/19 1000  TempSrc:   PainSc: 0-No pain                 [name] DAVID

## 2019-06-04 NOTE — Anesthesia Preprocedure Evaluation (Addendum)
Anesthesia Evaluation  Patient identified by MRN, date of birth, ID band Patient awake    Reviewed: Allergy & Precautions, NPO status , Patient's Chart, lab work & pertinent test results, reviewed documented beta blocker date and time   Airway Mallampati: I  TM Distance: >3 FB Neck ROM: Full    Dental   Pulmonary neg pulmonary ROS,    Pulmonary exam normal        Cardiovascular hypertension, Pt. on home beta blockers and Pt. on medications negative cardio ROS Normal cardiovascular exam     Neuro/Psych PSYCHIATRIC DISORDERS Anxiety Depression negative neurological ROS     GI/Hepatic Neg liver ROS, GERD  Medicated,  Endo/Other  Morbid obesity  Renal/GU negative Renal ROS  negative genitourinary   Musculoskeletal negative musculoskeletal ROS (+)   Abdominal   Peds  Hematology negative hematology ROS (+)   Anesthesia Other Findings Uterine polyp  Reproductive/Obstetrics                            Anesthesia Physical Anesthesia Plan  ASA: II  Anesthesia Plan: General   Post-op Pain Management:    Induction: Intravenous  PONV Risk Score and Plan: 3 and Ondansetron, Dexamethasone and Midazolam  Airway Management Planned: LMA  Additional Equipment:   Intra-op Plan:   Post-operative Plan: Extubation in OR  Informed Consent: I have reviewed the patients History and Physical, chart, labs and discussed the procedure including the risks, benefits and alternatives for the proposed anesthesia with the patient or authorized representative who has indicated his/her understanding and acceptance.       Plan Discussed with: CRNA and Surgeon  Anesthesia Plan Comments:        Anesthesia Quick Evaluation

## 2019-06-05 ENCOUNTER — Encounter (HOSPITAL_BASED_OUTPATIENT_CLINIC_OR_DEPARTMENT_OTHER): Payer: Self-pay | Admitting: Obstetrics & Gynecology

## 2019-07-12 DIAGNOSIS — I10 Essential (primary) hypertension: Secondary | ICD-10-CM | POA: Diagnosis not present

## 2019-07-12 DIAGNOSIS — E6609 Other obesity due to excess calories: Secondary | ICD-10-CM | POA: Diagnosis not present

## 2019-07-12 DIAGNOSIS — E7800 Pure hypercholesterolemia, unspecified: Secondary | ICD-10-CM | POA: Diagnosis not present

## 2019-07-12 DIAGNOSIS — E1169 Type 2 diabetes mellitus with other specified complication: Secondary | ICD-10-CM | POA: Diagnosis not present

## 2019-07-12 DIAGNOSIS — Z0000 Encounter for general adult medical examination without abnormal findings: Secondary | ICD-10-CM | POA: Diagnosis not present

## 2019-07-12 DIAGNOSIS — F419 Anxiety disorder, unspecified: Secondary | ICD-10-CM | POA: Diagnosis not present

## 2019-07-12 DIAGNOSIS — Z1389 Encounter for screening for other disorder: Secondary | ICD-10-CM | POA: Diagnosis not present

## 2019-07-28 ENCOUNTER — Emergency Department (HOSPITAL_COMMUNITY)
Admission: EM | Admit: 2019-07-28 | Discharge: 2019-07-28 | Disposition: A | Discharge status: Home / Self Care | Payer: Medicare HMO | Attending: Emergency Medicine | Admitting: Emergency Medicine

## 2019-07-28 ENCOUNTER — Encounter (HOSPITAL_COMMUNITY): Payer: Self-pay | Admitting: Emergency Medicine

## 2019-07-28 ENCOUNTER — Other Ambulatory Visit: Payer: Self-pay

## 2019-07-28 ENCOUNTER — Emergency Department (HOSPITAL_COMMUNITY): Payer: Medicare HMO

## 2019-07-28 DIAGNOSIS — I10 Essential (primary) hypertension: Secondary | ICD-10-CM | POA: Insufficient documentation

## 2019-07-28 DIAGNOSIS — M25571 Pain in right ankle and joints of right foot: Secondary | ICD-10-CM | POA: Diagnosis not present

## 2019-07-28 DIAGNOSIS — Z91040 Latex allergy status: Secondary | ICD-10-CM | POA: Diagnosis not present

## 2019-07-28 DIAGNOSIS — Y92000 Kitchen of unspecified non-institutional (private) residence as  the place of occurrence of the external cause: Secondary | ICD-10-CM | POA: Diagnosis not present

## 2019-07-28 DIAGNOSIS — M79651 Pain in right thigh: Secondary | ICD-10-CM | POA: Diagnosis not present

## 2019-07-28 DIAGNOSIS — W19XXXA Unspecified fall, initial encounter: Secondary | ICD-10-CM

## 2019-07-28 DIAGNOSIS — Y999 Unspecified external cause status: Secondary | ICD-10-CM | POA: Insufficient documentation

## 2019-07-28 DIAGNOSIS — S99911A Unspecified injury of right ankle, initial encounter: Secondary | ICD-10-CM | POA: Diagnosis not present

## 2019-07-28 DIAGNOSIS — S76911A Strain of unspecified muscles, fascia and tendons at thigh level, right thigh, initial encounter: Secondary | ICD-10-CM | POA: Diagnosis not present

## 2019-07-28 DIAGNOSIS — M25551 Pain in right hip: Secondary | ICD-10-CM | POA: Diagnosis not present

## 2019-07-28 DIAGNOSIS — Y9389 Activity, other specified: Secondary | ICD-10-CM | POA: Diagnosis not present

## 2019-07-28 DIAGNOSIS — Z79899 Other long term (current) drug therapy: Secondary | ICD-10-CM | POA: Insufficient documentation

## 2019-07-28 DIAGNOSIS — S79921A Unspecified injury of right thigh, initial encounter: Secondary | ICD-10-CM | POA: Diagnosis not present

## 2019-07-28 DIAGNOSIS — I959 Hypotension, unspecified: Secondary | ICD-10-CM | POA: Diagnosis not present

## 2019-07-28 DIAGNOSIS — Z7982 Long term (current) use of aspirin: Secondary | ICD-10-CM | POA: Diagnosis not present

## 2019-07-28 DIAGNOSIS — R52 Pain, unspecified: Secondary | ICD-10-CM | POA: Diagnosis not present

## 2019-07-28 DIAGNOSIS — W07XXXA Fall from chair, initial encounter: Secondary | ICD-10-CM | POA: Diagnosis not present

## 2019-07-28 DIAGNOSIS — S79919A Unspecified injury of unspecified hip, initial encounter: Secondary | ICD-10-CM | POA: Diagnosis not present

## 2019-07-28 MED ORDER — TRAMADOL HCL 50 MG PO TABS: 50.0000 mg | ORAL_TABLET | Freq: Four times a day (QID) | ORAL | 0 refills | Status: DC | PRN

## 2019-07-28 MED ORDER — TRAMADOL HCL 50 MG PO TABS
50.0000 mg | ORAL_TABLET | Freq: Once | ORAL | Status: AC
  Administered 2019-07-28: 50 mg via ORAL
  Filled 2019-07-28: qty 1

## 2019-07-28 NOTE — ED Provider Notes (Signed)
[facility] EMERGENCY DEPARTMENT Provider Note   CSN: [id number] Arrival date & time: 07/28/19  1554     History   Chief Complaint No chief complaint on file.   HPI Suzanne Patterson is a 68 y.o. female with a history of anxiety, depression, DM, GERD and HTN presenting with persistent right hip, upper thigh and ankle pain since falling at 11 am today.  She was standing on her kitchen chair repairing a cabinet when the chair burst beneath her, causing fall onto her right side.  She denies head, neck and back pain, also denies chest pain, abdominal or flank pain, no pleuritic pain or sob.  She has employed tylenol and rest without improvement in her symptoms.  She denies weakness or numbness in the leg or foot but is unable to bear weight due to pain.      The history is provided by the patient.    Past Medical History:  Diagnosis Date   Anxiety    Depression    Dysrhythmia    Environmental and seasonal allergies    GERD (gastroesophageal reflux disease)    Hypertension    Uterine polyp     There are no active problems to display for this patient.   Past Surgical History:  Procedure Laterality Date   DILATATION & CURETTAGE/HYSTEROSCOPY WITH MYOSURE N/A 06/04/2019   Procedure: DILATATION & CURETTAGE/HYSTEROSCOPY WITH MYOSURE;  Surgeon: Janyth Pupa, DO;  Location: [facility];  Service: Gynecology;  Laterality: N/A;  [place] Myosure Rep will be here.  Confirmed on 05/29/19 CS   EXCISION OF SKIN TAG Right 06/04/2019   Procedure: EXCISION OF SKIN TAG;  Surgeon: Janyth Pupa, DO;  Location: [facility];  Service: Gynecology;  Laterality: Right;   FOOT SURGERY Right      OB History   No obstetric history on file.      Home Medications    Prior to Admission medications   Medication Sig Start Date End Date Taking? Authorizing Provider  albuterol (VENTOLIN HFA) 108 (90 Base) MCG/ACT inhaler Inhale into the lungs every 6 (six)  hours as needed for wheezing or shortness of breath.    [provider]  ALPRAZolam Duanne Moron) 0.5 MG tablet Take 0.5 mg by mouth at bedtime as needed for anxiety.    [provider]  amLODipine (NORVASC) 2.5 MG tablet Take 2.5 mg by mouth daily.    [provider]  aspirin 81 MG chewable tablet Chew 81 mg by mouth daily.    [provider]  carvedilol (COREG) 12.5 MG tablet Take 12.5 mg by mouth 2 (two) times daily. 05/29/18   [provider]  escitalopram (LEXAPRO) 5 MG tablet Take 5 mg by mouth daily. 05/29/18   [provider]  olmesartan-hydrochlorothiazide (BENICAR HCT) 40-25 MG tablet Take 1 tablet by mouth daily.    [provider]  omeprazole (PRILOSEC OTC) 20 MG tablet Take 20 mg by mouth daily.    [provider]  rosuvastatin (CRESTOR) 5 MG tablet  05/23/18   [provider]  traMADol (ULTRAM) 50 MG tablet Take 1 tablet (50 mg total) by mouth every 6 (six) hours as needed. 07/28/19   Evalee Jefferson, PA-C    Family History No family history on file.  Social History Social History   Tobacco Use   Smoking status: Never Smoker   Smokeless tobacco: Never Used  Substance Use Topics   Alcohol use: No   Drug use: No  Allergies   Latex, Codeine, and Hydrocodone   Review of Systems Review of Systems  Constitutional: Negative for fever.  Musculoskeletal: Positive for arthralgias. Negative for joint swelling and myalgias.  Skin: Negative.  Negative for wound.  Neurological: Negative for weakness, numbness and headaches.     Physical Exam Updated Vital Signs BP 140/80 (BP Location: Right Arm)    Pulse 72    Temp 98.8 F (37.1 C) (Oral)    Resp 18    Ht 5\' 1"  (1.549 m)    Wt (!) 140.6 kg    SpO2 96%    BMI 58.57 kg/m   Physical Exam Vitals signs and nursing note reviewed.  Constitutional:      Appearance: She is well-developed.  HENT:     Head: Atraumatic.  Neck:     Musculoskeletal:  Normal range of motion.  Cardiovascular:     Comments: Pulses equal bilaterally Musculoskeletal:        General: Tenderness and signs of injury present. No swelling or deformity.     Right hip: She exhibits decreased range of motion and tenderness. She exhibits no bony tenderness, no swelling, no crepitus and no deformity.     Right ankle: She exhibits ecchymosis. She exhibits normal range of motion, no swelling, no deformity and normal pulse. Tenderness. Lateral malleolus tenderness found. Achilles tendon exhibits no pain and no defect.     Comments: Tender to palpation right medial and anterior upper thigh.  There is no palpable deformity, no bruising.  Compartments are soft.  Skin:    General: Skin is warm and dry.     Findings: Bruising present.     Comments: Small dime sized bruise posterior medial right ankle.  Neurological:     Mental Status: She is alert.     Sensory: No sensory deficit.     Deep Tendon Reflexes: Reflexes normal.      ED Treatments / Results  Labs (all labs ordered are listed, but only abnormal results are displayed) Labs Reviewed - No data to display  EKG None  Radiology Dg Pelvis 1-2 Views  Result Date: 07/28/2019 CLINICAL DATA:  Fall, pain EXAM: RIGHT ANKLE - COMPLETE 3+ VIEW; RIGHT FEMUR 2 VIEWS; PELVIS - 1-2 VIEW COMPARISON:  None. FINDINGS: No fracture or dislocation of the pelvis in single frontal view. No fracture of the hips. Mild hip joint arthrosis. No fracture or dislocation of the right femur. No acute fracture or dislocation of the right ankle. There is plate and screw fixation of the distal right fibula with syndesmosis tightrope repair. 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 IMPRESSION: 1. No fracture or dislocation of the pelvis in single frontal view. No fracture of the hips. Mild hip joint arthrosis. 2.  No fracture or dislocation of the right femur. 3. No acute fracture or dislocation of the right ankle. There is plate  and screw fixation of the distal right fibula with syndesmosis tightrope repair. 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 Electronically Signed   By: Eddie Candle M.D.   On: 07/28/2019 17:53   Dg Ankle Complete Right  Result Date: 07/28/2019 CLINICAL DATA:  Fall, pain EXAM: RIGHT ANKLE - COMPLETE 3+ VIEW; RIGHT FEMUR 2 VIEWS; PELVIS - 1-2 VIEW COMPARISON:  None. FINDINGS: No fracture or dislocation of the pelvis in single frontal view. No fracture of the hips. Mild hip joint arthrosis. No fracture or dislocation of the right femur. No acute fracture or dislocation of the right ankle. There  is plate and screw fixation of the distal right fibula with syndesmosis tightrope repair. 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 IMPRESSION: 1. No fracture or dislocation of the pelvis in single frontal view. No fracture of the hips. Mild hip joint arthrosis. 2.  No fracture or dislocation of the right femur. 3. No acute fracture or dislocation of the right ankle. There is plate and screw fixation of the distal right fibula with syndesmosis tightrope repair. 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 Electronically Signed   By: Eddie Candle M.D.   On: 07/28/2019 17:53   Dg Femur Min 2 Views Right  Result Date: 07/28/2019 CLINICAL DATA:  Fall, pain EXAM: RIGHT ANKLE - COMPLETE 3+ VIEW; RIGHT FEMUR 2 VIEWS; PELVIS - 1-2 VIEW COMPARISON:  None. FINDINGS: No fracture or dislocation of the pelvis in single frontal view. No fracture of the hips. Mild hip joint arthrosis. No fracture or dislocation of the right femur. No acute fracture or dislocation of the right ankle. There is plate and screw fixation of the distal right fibula with syndesmosis tightrope repair. 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 IMPRESSION: 1. No fracture or dislocation of the pelvis in single frontal view. No fracture of the hips. Mild hip joint arthrosis. 2.  No fracture or  dislocation of the right femur. 3. No acute fracture or dislocation of the right ankle. There is plate and screw fixation of the distal right fibula with syndesmosis tightrope repair. 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 Electronically Signed   By: Eddie Candle M.D.   On: 07/28/2019 17:53    Procedures Procedures (including critical care time)  Medications Ordered in ED Medications  traMADol (ULTRAM) tablet 50 mg (50 mg Oral Given 07/28/19 1711)     Initial Impression / Assessment and Plan / ED Course  I have reviewed the triage vital signs and the nursing notes.  Pertinent labs & imaging results that were available during my care of the patient were reviewed by me and considered in my medical decision making (see chart for details).        Imaging reviewed and discussed with patient.  Exam and history is suggestive of an anterior thigh strain.  There is no exam findings to suggest compartment syndrome.  She is got good pedal pulses.  She was able to weight-bear in department.  Discussed home treatment including ice therapy.  She was prescribed a small quantity of tramadol.  Discussed NSAID use.  She has a general orthopedist Dr. Durward Fortes she was encouraged to follow-up with him as needed if symptoms are not improving over the next 10 to 14 days.  Final Clinical Impressions(s) / ED Diagnoses   Final diagnoses:  Fall  Muscle strain of right thigh, initial encounter    ED Discharge Orders         Ordered    traMADol (ULTRAM) 50 MG tablet  Every 6 hours PRN     07/28/19 1833           Evalee Jefferson, PA-C 07/28/19 1837    Fredia Sorrow, MD 07/28/19 [phone]

## 2019-07-28 NOTE — ED Triage Notes (Signed)
standing in a chair  Chair broke   Now with R hip/leg pain   Unable to bear weight or sit on   In by EMS

## 2019-07-31 ENCOUNTER — Encounter: Payer: Self-pay | Admitting: Orthopaedic Surgery

## 2019-07-31 ENCOUNTER — Ambulatory Visit (INDEPENDENT_AMBULATORY_CARE_PROVIDER_SITE_OTHER): Payer: Medicare HMO | Admitting: Orthopaedic Surgery

## 2019-07-31 ENCOUNTER — Other Ambulatory Visit: Payer: Self-pay

## 2019-07-31 DIAGNOSIS — M79604 Pain in right leg: Secondary | ICD-10-CM

## 2019-07-31 NOTE — Progress Notes (Signed)
Office Visit Note   Patient: Suzanne Patterson           Date of Birth: 1951/10/22           MRN: [id number] Visit Date: 07/31/2019              Requested by: [name], [place] [address] [place],  [place] [address] PCP: [name], MD   Assessment & Plan: Visit Diagnoses:  1. Pain in right leg     Plan: Acute onset of lateral right thigh pain after falling from a chair which she was standing about 4 days ago.  Films performed at any pain hospital were negative for hip femur and knee.  Feeling better but still having some "deep pain.  No numbness or tingling.  Able to ambulate.  I think should continue to improve over period of weeks without significant sequelae.  Would like to see her back in 4 weeks if no improvement.  I did complete handicap parking form  Follow-Up Instructions: Return if symptoms worsen or fail to improve.   Orders:  No orders of the defined types were placed in this encounter.  No orders of the defined types were placed in this encounter.     Procedures: No procedures performed   Clinical Data: No additional findings.   Subjective: Chief Complaint  Patient presents with  . Right Leg - Pain  Patient presents today for her right leg. She was standing on a chair Saturday and the chair exploded, causing her to call onto her right side. She went to the ER at [facility] and had x-rays of her right leg. She is having pain in her leg and bruising on her thigh. She has numbness in her knee. She is able to walk, but painful to do so. She is taking tylenol as needed. She was given tramadol at the ER, but does not take it.  Films were performed at [facility] of her right hip, femur and right knee.  No fractures identified.  She has had some bruising of her right thigh but is able to ambulate.  No numbness or tingling.  I did review the films in the PACS system and agree without evidence of fracture  HPI  Review of Systems   Objective: Vital Signs: BP 114/69   Pulse 69   Ht 5' 4.5" (1.638 m)   Wt (!) 310 lb (140.6 kg)   BMI 52.39 kg/m   Physical Exam Constitutional:      Appearance: She is well-developed.  Eyes:     Pupils: Pupils are equal, round, and reactive to light.  Pulmonary:     Effort: Pulmonary effort is normal.  Skin:    General: Skin is warm and dry.  Neurological:     Mental Status: She is alert and oriented to person, place, and time.  Psychiatric:        Behavior: Behavior normal.     Ortho Exam evaluated in a wheelchair.  There was some bruising in the posterior aspect of her mid right thigh.  She was able to fully extend and flex her knee.  No knee pain or effusion.  No hip pain with internal or external rotation.  Straight leg raise negative.  No back pain.  Some mild bruising along the lateral aspect of her right thigh.  No knee instability no swelling distally  Specialty Comments:  No specialty comments available.  Imaging: No results found.   PMFS History:  Patient Active Problem List   Diagnosis Date Noted  . Pain in right leg 07/31/2019   Past Medical History:  Diagnosis Date  . Anxiety   . Depression   . Dysrhythmia   . Environmental and seasonal allergies   . GERD (gastroesophageal reflux disease)   . Hypertension   . Uterine polyp     History reviewed. No pertinent family history.  Past Surgical History:  Procedure Laterality Date  . DILATATION & CURETTAGE/HYSTEROSCOPY WITH MYOSURE N/A 06/04/2019   Procedure: DILATATION & CURETTAGE/HYSTEROSCOPY WITH MYOSURE;  Surgeon: Janyth Pupa, DO;  Location: [facility];  Service: Gynecology;  Laterality: N/A;  [place] Myosure Rep will be here.  Confirmed on 05/29/19 CS  . EXCISION OF SKIN TAG Right 06/04/2019   Procedure: EXCISION OF SKIN TAG;  Surgeon: Janyth Pupa, DO;  Location: [facility];  Service: Gynecology;  Laterality: Right;  . FOOT SURGERY Right    Social History    Occupational History  . Not on file  Tobacco Use  . Smoking status: Never Smoker  . Smokeless tobacco: Never Used  Substance and Sexual Activity  . Alcohol use: No  . Drug use: No  . Sexual activity: Not on file

## 2019-08-03 ENCOUNTER — Other Ambulatory Visit: Payer: Self-pay | Admitting: *Deleted

## 2019-08-03 ENCOUNTER — Telehealth: Payer: Self-pay | Admitting: Orthopaedic Surgery

## 2019-08-03 MED ORDER — METHOCARBAMOL 500 MG PO TABS: 500.0000 mg | ORAL_TABLET | Freq: Two times a day (BID) | ORAL | 0 refills | Status: DC | PRN

## 2019-08-03 NOTE — Telephone Encounter (Signed)
I called patient. Per Dr. Durward Fortes, Robaxin faxed to [facility] on [address], 500 mg # 30 1 po BID PRN, 0 RF

## 2019-08-03 NOTE — Telephone Encounter (Signed)
Patient left a voicemail stating her right leg "feels tight when she walks" and is requesting a muscle relaxer, but nothing too strong.  Patient states she does not want pain medication.  Patient is aware that Dr. Durward Fortes is not in the office today.

## 2019-08-23 DIAGNOSIS — E7800 Pure hypercholesterolemia, unspecified: Secondary | ICD-10-CM | POA: Diagnosis not present

## 2019-08-23 DIAGNOSIS — E1169 Type 2 diabetes mellitus with other specified complication: Secondary | ICD-10-CM | POA: Diagnosis not present

## 2019-08-23 DIAGNOSIS — I10 Essential (primary) hypertension: Secondary | ICD-10-CM | POA: Diagnosis not present

## 2019-08-23 DIAGNOSIS — J4520 Mild intermittent asthma, uncomplicated: Secondary | ICD-10-CM | POA: Diagnosis not present

## 2019-08-23 DIAGNOSIS — E119 Type 2 diabetes mellitus without complications: Secondary | ICD-10-CM | POA: Diagnosis not present

## 2019-08-23 DIAGNOSIS — J45909 Unspecified asthma, uncomplicated: Secondary | ICD-10-CM | POA: Diagnosis not present

## 2019-09-10 DIAGNOSIS — J4520 Mild intermittent asthma, uncomplicated: Secondary | ICD-10-CM | POA: Diagnosis not present

## 2019-10-02 DIAGNOSIS — Z23 Encounter for immunization: Secondary | ICD-10-CM | POA: Diagnosis not present

## 2019-11-05 DIAGNOSIS — I10 Essential (primary) hypertension: Secondary | ICD-10-CM | POA: Diagnosis not present

## 2019-11-05 DIAGNOSIS — E1169 Type 2 diabetes mellitus with other specified complication: Secondary | ICD-10-CM | POA: Diagnosis not present

## 2019-11-05 DIAGNOSIS — J4520 Mild intermittent asthma, uncomplicated: Secondary | ICD-10-CM | POA: Diagnosis not present

## 2019-11-05 DIAGNOSIS — E7800 Pure hypercholesterolemia, unspecified: Secondary | ICD-10-CM | POA: Diagnosis not present

## 2019-11-05 DIAGNOSIS — J45909 Unspecified asthma, uncomplicated: Secondary | ICD-10-CM | POA: Diagnosis not present

## 2019-11-05 DIAGNOSIS — E119 Type 2 diabetes mellitus without complications: Secondary | ICD-10-CM | POA: Diagnosis not present

## 2019-11-14 ENCOUNTER — Other Ambulatory Visit: Payer: Self-pay

## 2019-11-14 DIAGNOSIS — Z20822 Contact with and (suspected) exposure to covid-19: Secondary | ICD-10-CM

## 2019-11-16 ENCOUNTER — Telehealth (HOSPITAL_COMMUNITY): Payer: Self-pay

## 2019-11-16 LAB — NOVEL CORONAVIRUS, NAA: SARS-CoV-2, NAA: NOT DETECTED

## 2019-12-05 DIAGNOSIS — I10 Essential (primary) hypertension: Secondary | ICD-10-CM | POA: Diagnosis not present

## 2019-12-05 DIAGNOSIS — J4520 Mild intermittent asthma, uncomplicated: Secondary | ICD-10-CM | POA: Diagnosis not present

## 2019-12-05 DIAGNOSIS — J45909 Unspecified asthma, uncomplicated: Secondary | ICD-10-CM | POA: Diagnosis not present

## 2019-12-05 DIAGNOSIS — E119 Type 2 diabetes mellitus without complications: Secondary | ICD-10-CM | POA: Diagnosis not present

## 2019-12-05 DIAGNOSIS — E1169 Type 2 diabetes mellitus with other specified complication: Secondary | ICD-10-CM | POA: Diagnosis not present

## 2019-12-05 DIAGNOSIS — E7800 Pure hypercholesterolemia, unspecified: Secondary | ICD-10-CM | POA: Diagnosis not present

## 2019-12-19 ENCOUNTER — Telehealth: Payer: Self-pay | Admitting: Orthopaedic Surgery

## 2019-12-19 NOTE — Telephone Encounter (Signed)
Patient called requesting a new handicap placard. Patient states to still be considered handicap. Patient asked for call back from Dr. Rudene Anda office. Patient phone number is [phone].

## 2019-12-19 NOTE — Telephone Encounter (Signed)
Please advise 

## 2019-12-19 NOTE — Telephone Encounter (Signed)
Called and spoke with patient. She will call us back to make appointment.

## 2019-12-19 NOTE — Telephone Encounter (Signed)
Needs new office visit for update if another parking placard required

## 2020-06-20 ENCOUNTER — Telehealth: Payer: Self-pay | Admitting: Orthopaedic Surgery

## 2020-06-20 NOTE — Telephone Encounter (Signed)
Patient called.   She is requesting a call back to get some advice about how to manage her knee injury until her appointment on Wednesday.   Call back: [phone]

## 2020-06-23 NOTE — Telephone Encounter (Signed)
Called and spoke with patient. She is doing much better. She states that she may even call to cancel her appointment if she continues to feel well.

## 2020-06-25 ENCOUNTER — Ambulatory Visit: Payer: Medicare HMO | Admitting: Orthopaedic Surgery

## 2020-07-03 ENCOUNTER — Other Ambulatory Visit: Payer: Self-pay

## 2020-07-03 ENCOUNTER — Ambulatory Visit (INDEPENDENT_AMBULATORY_CARE_PROVIDER_SITE_OTHER): Payer: Medicare Other | Admitting: Orthopaedic Surgery

## 2020-07-03 ENCOUNTER — Ambulatory Visit: Payer: Self-pay

## 2020-07-03 ENCOUNTER — Encounter: Payer: Self-pay | Admitting: Orthopaedic Surgery

## 2020-07-03 DIAGNOSIS — M25562 Pain in left knee: Secondary | ICD-10-CM

## 2020-07-03 DIAGNOSIS — M1712 Unilateral primary osteoarthritis, left knee: Secondary | ICD-10-CM | POA: Diagnosis not present

## 2020-07-03 DIAGNOSIS — M25462 Effusion, left knee: Secondary | ICD-10-CM

## 2020-07-03 MED ORDER — METHYLPREDNISOLONE ACETATE 40 MG/ML IJ SUSP
80.0000 mg | INTRAMUSCULAR | Status: AC | PRN
  Administered 2020-07-03: 80 mg via INTRA_ARTICULAR

## 2020-07-03 MED ORDER — BUPIVACAINE HCL 0.25 % IJ SOLN
2.0000 mL | INTRAMUSCULAR | Status: AC | PRN
  Administered 2020-07-03: 2 mL via INTRA_ARTICULAR

## 2020-07-03 MED ORDER — LIDOCAINE HCL 1 % IJ SOLN
2.0000 mL | INTRAMUSCULAR | Status: AC | PRN
  Administered 2020-07-03: 2 mL

## 2020-07-03 NOTE — Progress Notes (Signed)
Office Visit Note   Patient: Suzanne Patterson           Date of Birth: 11-05-1951           MRN: [id number] Visit Date: 07/03/2020              Requested by: [name], [place],  [place] [address] PCP: [name], MD   Assessment & Plan: Visit Diagnoses:  1. Left knee pain, unspecified chronicity   2. Effusion, left knee   3. Primary osteoarthritis of left knee     Plan:  #1: The knee was aspirated clear yellow fluid and then injected with corticosteroids.  She had marked relief in her pain after injection. #2: If she does not have good results with this injection she is going to give [place] a call and we will schedule her for an MRI scan of the left knee to rule out patella quadriceps possible injury as well as internal derangement of the left knee.  Follow-Up Instructions: Return if symptoms worsen or fail to improve, for Call if symptoms not improved to schedule MRI.   Orders:  Orders Placed This Encounter  Procedures  . XR KNEE 3 VIEW LEFT   No orders of the defined types were placed in this encounter.     Procedures: Large Joint Inj: L knee on 07/03/2020 3:13 PM Indications: pain and diagnostic evaluation Details: 25 G 1.5 in needle, anteromedial approach  Arthrogram: No  Medications: 2 mL lidocaine 1 %; 2 mL bupivacaine 0.25 %; 80 mg methylPREDNISolone acetate 40 MG/ML Aspirate: yellow Outcome: tolerated well, no immediate complications Procedure, treatment alternatives, risks and benefits explained, specific risks discussed. Consent was given by the patient. Immediately prior to procedure a time out was called to verify the correct patient, procedure, equipment, support staff and site/side marked as required. Patient was prepped and draped in the usual sterile fashion.       Clinical Data: No additional findings.   Subjective: Chief Complaint  Patient presents with  . Left Knee - Pain    HPI  Chronic is a  69 year old white female who is seen today for evaluation of her left knee.  Her history is that approximately 3 weeks ago she apparently tripped over a rocking chair and twisted her left knee and was actually caught by her husband..  She had walked okay for about 1 week and then she started exhibiting pain and discomfort in the knee.  She actually had one episode of popping in the knee.  This is in the anterior portion of the knee she has also noted some numbness in that area.   had all of a sudden started to fall but did not complete the fall to the ground.  She was caught by her husband.  She continued continuing to have pain discomfort and swelling in the knee after that 1 week of reprieve..  She therefore is been seen today for evaluation.  Review of Systems   Objective: Vital Signs: There were no vitals taken for this visit.  Physical Exam Constitutional:      Appearance: Normal appearance. She is well-developed. She is obese.  HENT:     Head: Normocephalic.  Eyes:     Pupils: Pupils are equal, round, and reactive to light.  Pulmonary:     Effort: Pulmonary effort is normal.  Skin:    General: Skin is warm and dry.  Neurological:     Mental Status: She  is alert and oriented to person, place, and time.  Psychiatric:        Behavior: Behavior normal.     Ortho Exam  Exam today reveals tenderness to palpation over the superior pole of the patella.  She is also tender over medial lateral joint lines.  She does appear to have clinically an effusion.  No warmth or erythema.  She is ligamentously stable.  She has no pain with rotation of the hip.  Specialty Comments:  No specialty comments available.  Imaging: XR KNEE 3 VIEW LEFT  Result Date: 07/03/2020 Three-view x-ray of the left knee reveals medial tibial plateau sclerosing.  Mild medial joint line narrowing.  There is periarticular spurs of the patellofemoral mainly at the distal femur.    PMFS History: Current Outpatient  Medications  Medication Sig Dispense Refill  . albuterol (VENTOLIN HFA) 108 (90 Base) MCG/ACT inhaler Inhale into the lungs every 6 (six) hours as needed for wheezing or shortness of breath.    . ALPRAZolam (XANAX) 0.5 MG tablet Take 0.5 mg by mouth at bedtime as needed for anxiety.    Marland Kitchen amLODipine (NORVASC) 2.5 MG tablet Take 2.5 mg by mouth daily.    Marland Kitchen aspirin 81 MG chewable tablet Chew 81 mg by mouth daily.    . carvedilol (COREG) 12.5 MG tablet Take 12.5 mg by mouth 2 (two) times daily.  5  . escitalopram (LEXAPRO) 5 MG tablet Take 5 mg by mouth daily.  1  . methocarbamol (ROBAXIN) 500 MG tablet Take 1 tablet (500 mg total) by mouth 2 (two) times daily as needed for muscle spasms. 30 tablet 0  . olmesartan-hydrochlorothiazide (BENICAR HCT) 40-25 MG tablet Take 1 tablet by mouth daily.    Marland Kitchen omeprazole (PRILOSEC OTC) 20 MG tablet Take 20 mg by mouth daily.    . rosuvastatin (CRESTOR) 5 MG tablet     . traMADol (ULTRAM) 50 MG tablet Take 1 tablet (50 mg total) by mouth every 6 (six) hours as needed. 20 tablet 0   No current facility-administered medications for this visit.    Patient Active Problem List   Diagnosis Date Noted  . Effusion, left knee 07/03/2020  . Primary osteoarthritis of left knee 07/03/2020  . Pain in right leg 07/31/2019   Past Medical History:  Diagnosis Date  . Anxiety   . Depression   . Dysrhythmia   . Environmental and seasonal allergies   . GERD (gastroesophageal reflux disease)   . Hypertension   . Uterine polyp     History reviewed. No pertinent family history.  Past Surgical History:  Procedure Laterality Date  . DILATATION & CURETTAGE/HYSTEROSCOPY WITH MYOSURE N/A 06/04/2019   Procedure: DILATATION & CURETTAGE/HYSTEROSCOPY WITH MYOSURE;  Surgeon: Janyth Pupa, DO;  Location: [facility];  Service: Gynecology;  Laterality: N/A;  [place] Myosure Rep will be here.  Confirmed on 05/29/19 CS  . EXCISION OF SKIN TAG Right 06/04/2019    Procedure: EXCISION OF SKIN TAG;  Surgeon: Janyth Pupa, DO;  Location: [facility];  Service: Gynecology;  Laterality: Right;  . FOOT SURGERY Right    Social History   Occupational History  . Not on file  Tobacco Use  . Smoking status: Never Smoker  . Smokeless tobacco: Never Used  Vaping Use  . Vaping Use: Never used  Substance and Sexual Activity  . Alcohol use: No  . Drug use: No  . Sexual activity: Not on file

## 2020-07-07 ENCOUNTER — Emergency Department (HOSPITAL_BASED_OUTPATIENT_CLINIC_OR_DEPARTMENT_OTHER): Payer: Medicare Other

## 2020-07-07 ENCOUNTER — Other Ambulatory Visit: Payer: Self-pay

## 2020-07-07 ENCOUNTER — Emergency Department (HOSPITAL_BASED_OUTPATIENT_CLINIC_OR_DEPARTMENT_OTHER)
Admission: EM | Admit: 2020-07-07 | Discharge: 2020-07-07 | Disposition: A | Discharge status: Home / Self Care | Payer: Medicare Other | Attending: Emergency Medicine | Admitting: Emergency Medicine

## 2020-07-07 ENCOUNTER — Encounter (HOSPITAL_BASED_OUTPATIENT_CLINIC_OR_DEPARTMENT_OTHER): Payer: Self-pay

## 2020-07-07 DIAGNOSIS — Z7982 Long term (current) use of aspirin: Secondary | ICD-10-CM | POA: Insufficient documentation

## 2020-07-07 DIAGNOSIS — Z91040 Latex allergy status: Secondary | ICD-10-CM | POA: Insufficient documentation

## 2020-07-07 DIAGNOSIS — R072 Precordial pain: Secondary | ICD-10-CM

## 2020-07-07 DIAGNOSIS — Z79899 Other long term (current) drug therapy: Secondary | ICD-10-CM | POA: Diagnosis not present

## 2020-07-07 DIAGNOSIS — R0789 Other chest pain: Secondary | ICD-10-CM | POA: Diagnosis present

## 2020-07-07 DIAGNOSIS — I10 Essential (primary) hypertension: Secondary | ICD-10-CM | POA: Insufficient documentation

## 2020-07-07 DIAGNOSIS — E119 Type 2 diabetes mellitus without complications: Secondary | ICD-10-CM | POA: Insufficient documentation

## 2020-07-07 LAB — TROPONIN I (HIGH SENSITIVITY)
Troponin I (High Sensitivity): 4 ng/L (ref <18)
Troponin I (High Sensitivity): 5 ng/L (ref <18)

## 2020-07-07 LAB — BASIC METABOLIC PANEL
Anion gap: 13 (ref 5–15)
BUN: 21 mg/dL (ref 8–23)
CO2: 27 mmol/L (ref 22–32)
Calcium: 9.2 mg/dL (ref 8.9–10.3)
Chloride: 97 mmol/L — ABNORMAL LOW (ref 98–111)
Creatinine, Ser: 0.93 mg/dL (ref 0.44–1.00)
GFR calc Af Amer: >60 mL/min (ref >60)
GFR calc non Af Amer: >60 mL/min (ref >60)
Glucose, Bld: 144 mg/dL — ABNORMAL HIGH (ref 70–99)
Potassium: 3.5 mmol/L (ref 3.5–5.1)
Sodium: 137 mmol/L (ref 135–145)

## 2020-07-07 LAB — CBC
HCT: 42.6 % (ref 36.0–46.0)
Hemoglobin: 13.3 g/dL (ref 12.0–15.0)
MCH: 27.5 pg (ref 26.0–34.0)
MCHC: 31.2 g/dL (ref 30.0–36.0)
MCV: 88 fL (ref 80.0–100.0)
Platelets: 300 10*3/uL (ref 150–400)
RBC: 4.84 MIL/uL (ref 3.87–5.11)
RDW: 14.7 % (ref 11.5–15.5)
WBC: 9 10*3/uL (ref 4.0–10.5)
nRBC: 0 % (ref 0.0–0.2)

## 2020-07-07 MED ORDER — SODIUM CHLORIDE 0.9% FLUSH
3.0000 mL | Freq: Once | INTRAVENOUS | Status: DC
  Filled 2020-07-07: qty 3

## 2020-07-07 NOTE — ED Triage Notes (Signed)
Pt c/o CP started ~730am-states she took xanax x 2 due to "feeling stressed"-NAD-to triage in w/c

## 2020-07-07 NOTE — Discharge Instructions (Addendum)
Start and resume taking a baby aspirin a day.  Important to follow-up with cardiology call and up an appointment.  Return for any recurrent chest pain at all.

## 2020-07-07 NOTE — ED Notes (Signed)
States feeling much better now, pt states she took Xanax this am and right before arriving to this facility, also took 81mg  PO baby ASA today as normal. On cont cardiac monitoring with cont POX monitoring and int NBP monitoring

## 2020-07-07 NOTE — ED Notes (Signed)
Pt placed in exam room 5, ambulated to rest room for urine spec. No distress noted when ambulating, placed on cont cardiac monitoring, lab work reviewed

## 2020-07-08 ENCOUNTER — Telehealth: Payer: Self-pay | Admitting: Cardiology

## 2020-07-08 NOTE — Telephone Encounter (Signed)
      I went in pt's chart to see what his discharge summary said from yesterday at [facility] in [facility]

## 2020-07-17 ENCOUNTER — Encounter: Payer: Self-pay | Admitting: Cardiology

## 2020-07-17 ENCOUNTER — Other Ambulatory Visit: Payer: Self-pay

## 2020-07-17 ENCOUNTER — Ambulatory Visit: Payer: Medicare Other | Admitting: Cardiology

## 2020-07-17 VITALS — BP 136/80 | HR 57 | Ht 65.0 in | Wt 263.2 lb

## 2020-07-17 DIAGNOSIS — I10 Essential (primary) hypertension: Secondary | ICD-10-CM | POA: Diagnosis not present

## 2020-07-17 DIAGNOSIS — R072 Precordial pain: Secondary | ICD-10-CM | POA: Diagnosis not present

## 2020-07-17 DIAGNOSIS — R0789 Other chest pain: Secondary | ICD-10-CM | POA: Insufficient documentation

## 2020-07-17 DIAGNOSIS — I208 Other forms of angina pectoris: Secondary | ICD-10-CM | POA: Diagnosis not present

## 2020-07-17 NOTE — Patient Instructions (Signed)
Medication Instructions:  SEE INSTRUCTIONS SHEET *If you need a refill on your cardiac medications before your next appointment, please call your pharmacy*   Lab Work: SEE INSTRUCTIONS SHEET  If you have labs (blood work) drawn today and your tests are completely normal, you will receive your results only by: Marland Kitchen MyChart Message (if you have MyChart) OR . A paper copy in the mail If you have any lab test that is abnormal or we need to change your treatment, we will call you to review the results.   Testing/Procedures: WILL BE SCHEDULE AT [place] Your physician has requested that you have cardiac CTa. Cardiac computed tomography (CT) is a painless test that uses an x-ray machine to take clear, detailed pictures of your heart. Please follow instruction sheet as given.     Follow-Up: At [facility], you and your health needs are our priority.  As part of our continuing mission to provide you with exceptional heart care, we have created designated Provider Care Teams.  These Care Teams include your primary Cardiologist (physician) and Advanced Practice Providers (APPs -  Physician Assistants and Nurse Practitioners) who all work together to provide you with the care you need, when you need it.  We recommend signing up for the patient portal called "MyChart".  Sign up information is provided on this After Visit Summary.  MyChart is used to connect with patients for Virtual Visits (Telemedicine).  Patients are able to view lab/test results, encounter notes, upcoming appointments, etc.  Non-urgent messages can be sent to your provider as well.   To learn more about what you can do with MyChart, go to [email or web address].    Your next appointment:   2 month(s)  The format for your next appointment:   In Person  Provider:   Glenetta Hew, MD   Other Instructions  Your cardiac CT will be scheduled at the below location:    [facility] [address] [place], [place] [address] [phone]    [facility], please arrive at the [facility] main entrance of [facility] 30 minutes prior to test start time. Proceed to the [facility] Radiology Department (first floor) to check-in and test prep.    Please follow these instructions carefully (unless otherwise directed):  Please have labs -BMP ONE WEEK PRIOR TO TEST   On the Night Before the Test: . Be sure to Drink plenty of water. . Do not consume any caffeinated/decaffeinated beverages or chocolate 12 hours prior to your test. . Do not take any antihistamines 12 hours prior to your test.   On the Day of the Test: . Drink plenty of water. Do not drink any water within one hour of the test. . Do not eat any food 4 hours prior to the test. . You may take your regular medications prior to the test.  . Take your schedule Carvedilol 12.5 mg  two hours prior to test. . FEMALES- please wear underwire-free bra if available         After the Test: . Drink plenty of water. . After receiving IV contrast, you may experience a mild flushed feeling. This is normal. . On occasion, you may experience a mild rash up to 24 hours after the test. This is not dangerous. If this occurs, you can take Benadryl 25 mg and increase your fluid intake. . If you experience trouble breathing, this can be serious. If it is severe call  911 IMMEDIATELY. If it is mild, please call our office.    Once we have confirmed authorization from your insurance company, we will call you to set up a date and time for your test. Based on how quickly your insurance processes prior authorizations requests, please allow up to 4 weeks to be contacted for scheduling your Cardiac CT appointment. Be advised that routine Cardiac CT appointments could be scheduled as many as 8 weeks after your provider has ordered it.  For non-scheduling related questions, please contact  the cardiac imaging nurse navigator should you have any questions/concerns: Marchia Bond, Cardiac Imaging Nurse Navigator [place] Saver, Interim Cardiac Imaging Nurse [place] [facility] Direct Office Dial: [phone]   For scheduling needs, including cancellations and rescheduling, please call Vivien Rota at [phone], option 3.
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
## 2020-07-22 ENCOUNTER — Encounter: Payer: Self-pay | Admitting: Cardiology

## 2020-07-22 DIAGNOSIS — I10 Essential (primary) hypertension: Secondary | ICD-10-CM | POA: Insufficient documentation

## 2020-07-22 NOTE — Assessment & Plan Note (Signed)
Her blood pressure looks pretty good today, but it does seem to be going up and down for her.  I suspect this is probably related to her anxiety and stress.  We will continue to monitor pressures.  She does have room to increase amlodipine.  Per probably would not increase carvedilol based on resting heart rate.

## 2020-07-22 NOTE — Assessment & Plan Note (Signed)
If her ischemic evaluation is negative, I would suspect that the precordial pain is either musculoskeletal in nature or related to panic attack.  If there is no evidence of coronary disease, would argue against spasm.

## 2020-07-22 NOTE — Assessment & Plan Note (Addendum)
She does have some concerning features of her chest pain with the squeezing sensation radiating to her neck, but it is atypical in that it is usually only at rest and not with exertion.  She does not really exert herself much which is one reason why that may be the case.  She does have significant risk factors of obesity, hypertension, hyperlipidemia and per her report diet controlled diabetes.  With rest symptoms, and not necessarily exertional symptoms cannot exclude coronary spasm.  The fact that symptoms are alleviated by Xanax though with argue that these are possibly panic attacks.  Based on her level of anxiety and concern and risk factors with multiple episodes, I feel it is prudent to exclude ischemic CAD.  Given her body habitus, I do not think a Myoview stress test would be reasonable.  She would not go to walk on treadmill because her ankle.  Most prudent initial test based on recent ACC guidelines as to evaluate for ischemia with coronary CT angiogram.  Plan: Coronary CT Angiogram with Coronary Calcium Score possible CT FFR.  She is already on aspirin, statin (albeit low-dose, with excellent control as of 2019) along with carvedilol and amlodipine.  I would not necessarily make any further adjustments until I see results of her studies.

## 2020-08-06 NOTE — ED Provider Notes (Signed)
[facility] EMERGENCY DEPARTMENT Provider Note   CSN: [id number] Arrival date & time: 07/07/20  1440     History Chief Complaint  Patient presents with  . Chest Pain    Suzanne Patterson is a 69 y.o. female.  HPI  HPI: A 69 year old patient with a history of treated diabetes, hypertension, hypercholesterolemia and obesity presents for evaluation of chest pain. Initial onset of pain was approximately 1-3 hours ago. The patient's chest pain is described as heaviness/pressure/tightness and is not worse with exertion. The patient's chest pain is middle- or left-sided, is not well-localized, is not sharp and does not radiate to the arms/jaw/neck. The patient does not complain of nausea and denies diaphoresis. The patient has no history of stroke, has no history of peripheral artery disease, has not smoked in the past 90 days and has no relevant family history of coronary artery disease (first degree relative at less than age 63).   Past Medical History:  Diagnosis Date  . Anxiety   . Depression   . DM (diabetes mellitus), type 2 (HCC)    dIET CONTROLLED  . Environmental and seasonal allergies   . GERD (gastroesophageal reflux disease)   . Hyperlipidemia associated with type 2 diabetes mellitus ([place])   . Hypertension   . Uterine polyp     Patient Active Problem List   Diagnosis Date Noted  . Essential hypertension 07/22/2020  . Other forms of angina pectoris ([place]) 07/17/2020  . Precordial pain 07/17/2020  . Effusion, left knee 07/03/2020  . Primary osteoarthritis of left knee 07/03/2020  . Pain in right leg 07/31/2019    Past Surgical History:  Procedure Laterality Date  . DILATATION & CURETTAGE/HYSTEROSCOPY WITH MYOSURE N/A 06/04/2019   Procedure: DILATATION & CURETTAGE/HYSTEROSCOPY WITH MYOSURE;  Surgeon: Janyth Pupa, DO;  Location: [facility];  Service: Gynecology;  Laterality: N/A;  [place] Myosure Rep will be here.  Confirmed on 05/29/19 CS    . EXCISION OF SKIN TAG Right 06/04/2019   Procedure: EXCISION OF SKIN TAG;  Surgeon: Janyth Pupa, DO;  Location: [facility];  Service: Gynecology;  Laterality: Right;  . FOOT SURGERY Right      OB History   No obstetric history on file.     Family History  Family history unknown: Yes    Social History   Tobacco Use  . Smoking status: Never Smoker  . Smokeless tobacco: Never Used  Vaping Use  . Vaping Use: Never used  Substance Use Topics  . Alcohol use: No  . Drug use: No    Home Medications Prior to Admission medications   Medication Sig Start Date End Date Taking? Authorizing Provider  albuterol (VENTOLIN HFA) 108 (90 Base) MCG/ACT inhaler Inhale into the lungs every 6 (six) hours as needed for wheezing or shortness of breath.    [provider]  ALPRAZolam Duanne Moron) 0.5 MG tablet Take 0.5 mg by mouth at bedtime as needed for anxiety.    [provider]  amLODipine (NORVASC) 2.5 MG tablet Take 2.5 mg by mouth daily.    [provider]  aspirin 81 MG chewable tablet Chew 81 mg by mouth daily.    [provider]  carvedilol (COREG) 12.5 MG tablet Take 12.5 mg by mouth 2 (two) times daily. 05/29/18   [provider]  co-enzyme Q-10 30 MG capsule Take 30 mg by mouth 3 (three) times daily.    [provider]  escitalopram (LEXAPRO) 5 MG tablet Take 5  mg by mouth daily. 05/29/18   [provider]  famotidine (PEPCID) 10 MG tablet Take 10 mg by mouth 2 (two) times daily.    [provider]  ondansetron (ZOFRAN) 4 MG tablet Take 1 tablet by mouth as needed. 06/17/20   [provider]  rosuvastatin (CRESTOR) 5 MG tablet Takes on Monday, Wednesday and fridays 05/23/18   [provider]    Allergies    Latex, Codeine, and Hydrocodone  Review of Systems   Review of Systems  Constitutional: Negative for chills and fever.  HENT: Negative for rhinorrhea and sore throat.   Eyes:  Negative for visual disturbance.  Respiratory: Negative for cough and shortness of breath.   Cardiovascular: Positive for chest pain. Negative for leg swelling.  Gastrointestinal: Negative for abdominal pain, diarrhea, nausea and vomiting.  Genitourinary: Negative for dysuria.  Musculoskeletal: Negative for back pain and neck pain.  Skin: Negative for rash.  Neurological: Negative for dizziness, light-headedness and headaches.  Hematological: Does not bruise/bleed easily.  Psychiatric/Behavioral: Negative for confusion.    Physical Exam Updated Vital Signs BP (!) 175/89 (BP Location: Right Arm)   Pulse 61   Temp 98.9 F (37.2 C) (Oral)   Resp 19   Ht 1.6 m (5\' 3" )   Wt 119.3 kg   SpO2 100%   BMI 46.59 kg/m   Physical Exam Vitals and nursing note reviewed.  Constitutional:      General: She is not in acute distress.    Appearance: Normal appearance. She is well-developed.  HENT:     Head: Normocephalic and atraumatic.  Eyes:     Extraocular Movements: Extraocular movements intact.     Conjunctiva/sclera: Conjunctivae normal.     Pupils: Pupils are equal, round, and reactive to light.  Cardiovascular:     Rate and Rhythm: Normal rate and regular rhythm.     Heart sounds: No murmur heard.   Pulmonary:     Effort: Pulmonary effort is normal. No respiratory distress.     Breath sounds: Normal breath sounds.  Abdominal:     Palpations: Abdomen is soft.     Tenderness: There is no abdominal tenderness.  Musculoskeletal:        General: Normal range of motion.     Cervical back: Normal range of motion and neck supple.  Skin:    General: Skin is warm and dry.     Capillary Refill: Capillary refill takes less than 2 seconds.  Neurological:     General: No focal deficit present.     Mental Status: She is alert and oriented to person, place, and time.     Cranial Nerves: No cranial nerve deficit.     Sensory: No sensory deficit.     Motor: No weakness.     ED Results  / Procedures / Treatments   Labs (all labs ordered are listed, but only abnormal results are displayed) Labs Reviewed  BASIC METABOLIC PANEL - Abnormal; Notable for the following components:      Result Value   Chloride 97 (*)    Glucose, Bld 144 (*)    All other components within normal limits  CBC  TROPONIN I (HIGH SENSITIVITY)  TROPONIN I (HIGH SENSITIVITY)    EKG EKG Interpretation  Date/Time:  Monday July 07 2020 14:49:31 EDT Ventricular Rate:  69 PR Interval:  200 QRS Duration: 98 QT Interval:  446 QTC Calculation: 477 R Axis:   48 Text Interpretation: Normal sinus rhythm Cannot rule out Anterior infarct ,  age undetermined Abnormal ECG Confirmed by Fredia Sorrow [phone]) on 07/07/2020 5:46:12 PM   Radiology No results found.  Procedures Procedures (including critical care time)  Medications Ordered in ED Medications - No data to display  ED Course  I have reviewed the triage vital signs and the nursing notes.  Pertinent labs & imaging results that were available during my care of the patient were reviewed by me and considered in my medical decision making (see chart for details).    MDM Rules/Calculators/A&P HEAR Score: 5                         Patient's onset of the chest pain was 730 this morning.  First episode of the chest pain lasted 1 to 3 hours.  Troponins x2 have been negative EKG without acute changes.  Chest x-ray negative.  Patient with a significant heart score.  However has had the pain for a period of time and is without any significant pain now and troponins x2 as stated were more negative.  Patient with some risk factors.  But the 2 - troponins are very reassuring.  Patient stable for discharge home and follow-up with cardiology. Patient will return for any recurrent chest pain.  She understands that is important to do.  Final Clinical Impression(s) / ED Diagnoses Final diagnoses:  Precordial pain    Rx / DC Orders ED Discharge  Orders    None       Fredia Sorrow, MD 08/06/20 2218

## 2020-08-15 ENCOUNTER — Encounter: Payer: Self-pay | Admitting: *Deleted

## 2020-08-15 ENCOUNTER — Telehealth: Payer: Self-pay | Admitting: *Deleted

## 2020-08-15 DIAGNOSIS — I208 Other forms of angina pectoris: Secondary | ICD-10-CM

## 2020-08-15 DIAGNOSIS — R072 Precordial pain: Secondary | ICD-10-CM

## 2020-08-15 NOTE — Telephone Encounter (Signed)
Patient  Brought some information to the office - stating she ha an  cotrast reaction to  Ionic contrast. - she develops hives    Dr Ellyn Hack made aware- per  Order  Will cancel Coronary CTA.  AND SCHEDULE LEXISCAN MYOVIWEW   PATIENT IS AWARE  SHE WILL NEED A 2 DAY STUDY FOR LEXISCAN  IT WILL BE OKAY FOR PATIENT TO USE  1/2 DOSE OF HER XANAX. PATIENT IS AWARE SHE WILL NEED HER HUSBAND TO ACCOMPANY HER A D DRIVE TO AND FROM  OFFICE.  SHE IS AWARE THE OFFICE WILL CONTACT FOR DATE AND TIME.  UPDATE ALLERGY LIST IN [facility] EPIC

## 2020-08-19 ENCOUNTER — Telehealth (HOSPITAL_COMMUNITY): Payer: Self-pay

## 2020-08-19 NOTE — Telephone Encounter (Signed)
Encounter complete. 

## 2020-08-20 ENCOUNTER — Other Ambulatory Visit: Payer: Self-pay

## 2020-08-20 ENCOUNTER — Ambulatory Visit (HOSPITAL_COMMUNITY)
Admission: RE | Admit: 2020-08-20 | Discharge: 2020-08-20 | Disposition: A | Discharge status: Home / Self Care | Payer: Medicare Other | Source: Ambulatory Visit | Attending: Cardiology | Admitting: Cardiology

## 2020-08-20 DIAGNOSIS — I208 Other forms of angina pectoris: Secondary | ICD-10-CM | POA: Insufficient documentation

## 2020-08-20 DIAGNOSIS — R072 Precordial pain: Secondary | ICD-10-CM | POA: Diagnosis present

## 2020-08-20 HISTORY — PX: NM MYOVIEW LTD: HXRAD82

## 2020-08-20 MED ORDER — TECHNETIUM TC 99M TETROFOSMIN IV KIT
30.1000 | PACK | Freq: Once | INTRAVENOUS | Status: AC | PRN
  Administered 2020-08-20: 30.1 via INTRAVENOUS
  Filled 2020-08-20: qty 31

## 2020-08-20 MED ORDER — REGADENOSON 0.4 MG/5ML IV SOLN
0.4000 mg | Freq: Once | INTRAVENOUS | Status: AC
Start: 2020-08-20 — End: 2020-08-20
  Administered 2020-08-20: 0.4 mg via INTRAVENOUS

## 2020-08-20 MED ORDER — AMINOPHYLLINE 25 MG/ML IV SOLN
75.0000 mg | Freq: Once | INTRAVENOUS | Status: AC
Start: 2020-08-20 — End: 2020-08-20
  Administered 2020-08-20: 75 mg via INTRAVENOUS

## 2020-08-21 ENCOUNTER — Ambulatory Visit (HOSPITAL_COMMUNITY)
Admission: RE | Admit: 2020-08-21 | Discharge: 2020-08-21 | Disposition: A | Discharge status: Home / Self Care | Payer: Medicare Other | Source: Ambulatory Visit | Attending: Internal Medicine | Admitting: Internal Medicine

## 2020-08-21 LAB — MYOCARDIAL PERFUSION IMAGING
LV dias vol: 93 mL (ref 46–106)
LV sys vol: 29 mL
Peak HR: 85 {beats}/min
Rest HR: 60 {beats}/min
SDS: 0
SRS: 0
SSS: 0
TID: 0.83

## 2020-08-21 MED ORDER — TECHNETIUM TC 99M TETROFOSMIN IV KIT
30.5000 | PACK | Freq: Once | INTRAVENOUS | Status: AC | PRN
  Administered 2020-08-21: 30.5 via INTRAVENOUS

## 2020-08-22 ENCOUNTER — Telehealth: Payer: Self-pay | Admitting: Cardiology

## 2020-08-22 NOTE — Telephone Encounter (Signed)
Patient called into office in regards to recent testing. Patient would like to know if Dr. Ellyn Hack agrees with her PCP's diagnosis of "angina". Patient states that she recently had a  Stress test that was negative. Patient also would like to know if Dr. Ellyn Hack would like for her to continue taking her Aspirin 81mg . Patient also would like to know if it was safe for her to resume walking on her treadmill and exercising. Advised patient I would forward this message to [place] and his nurse.

## 2020-08-22 NOTE — Telephone Encounter (Signed)
Patient is requesting to speak with Dr. Allison Quarry nurse. She would like to know if she has angina. Please advise.

## 2020-08-23 NOTE — Telephone Encounter (Signed)
Her stress test was read as LOW RISK that would suggest no evidence of heart artery blockage that could explain symptoms.  In that situation, I would probably say that the diagnosis of angina is not accurate.  It is hard to know whether aspirin would be beneficial or not.  We did not go into the pros and cons of aspirin.  If risk factors are sufficient, then taking aspirin may be warranted, but the nuclear stress test would not suggest that is necessary.  Based on this finding, it should be fine to resume walking on the treadmill.  Glenetta Hew, MD

## 2020-08-26 NOTE — Telephone Encounter (Signed)
patient is aware  Of Dr Ellyn Hack recommendations. Verbalized understanding.

## 2020-09-24 IMAGING — DX RIGHT FEMUR 2 VIEWS
5 series · 5 of 5 positions shown · non-contrast
Comparison: None.

CLINICAL DATA: Fall, pain

EXAM:
RIGHT ANKLE - COMPLETE 3+ VIEW; RIGHT FEMUR 2 VIEWS; PELVIS - 1-2
VIEW

[femur ap (1 of 2)]
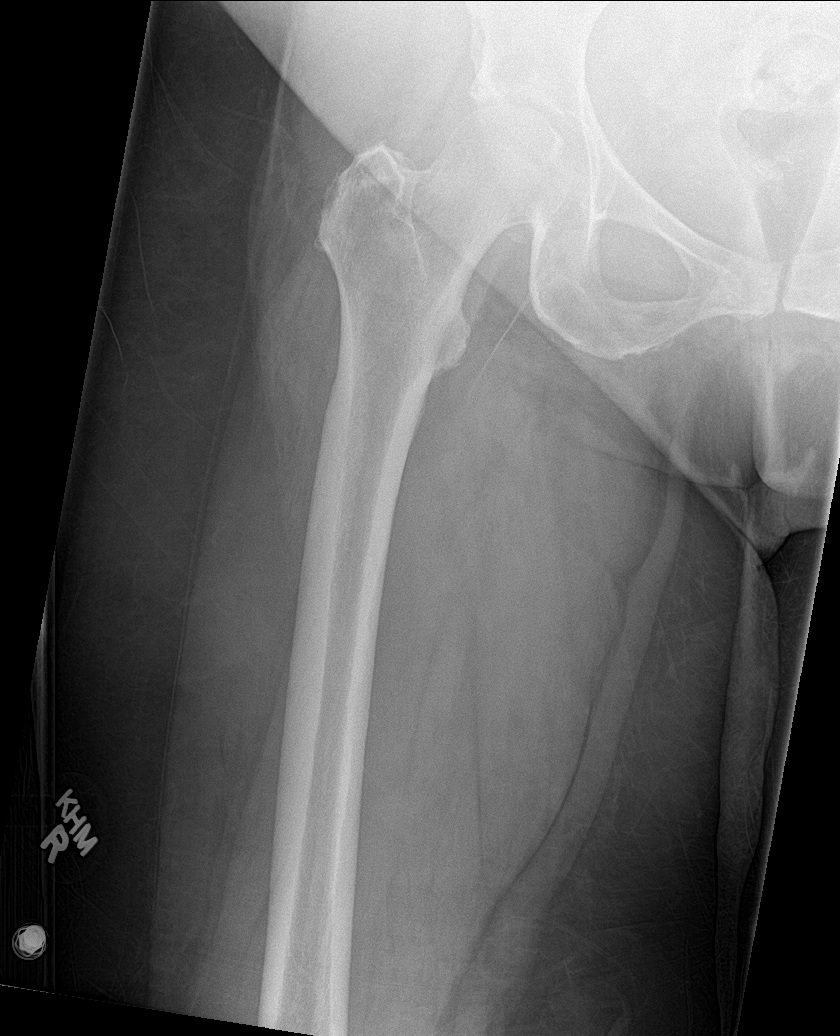

[femur ap (2 of 2)]
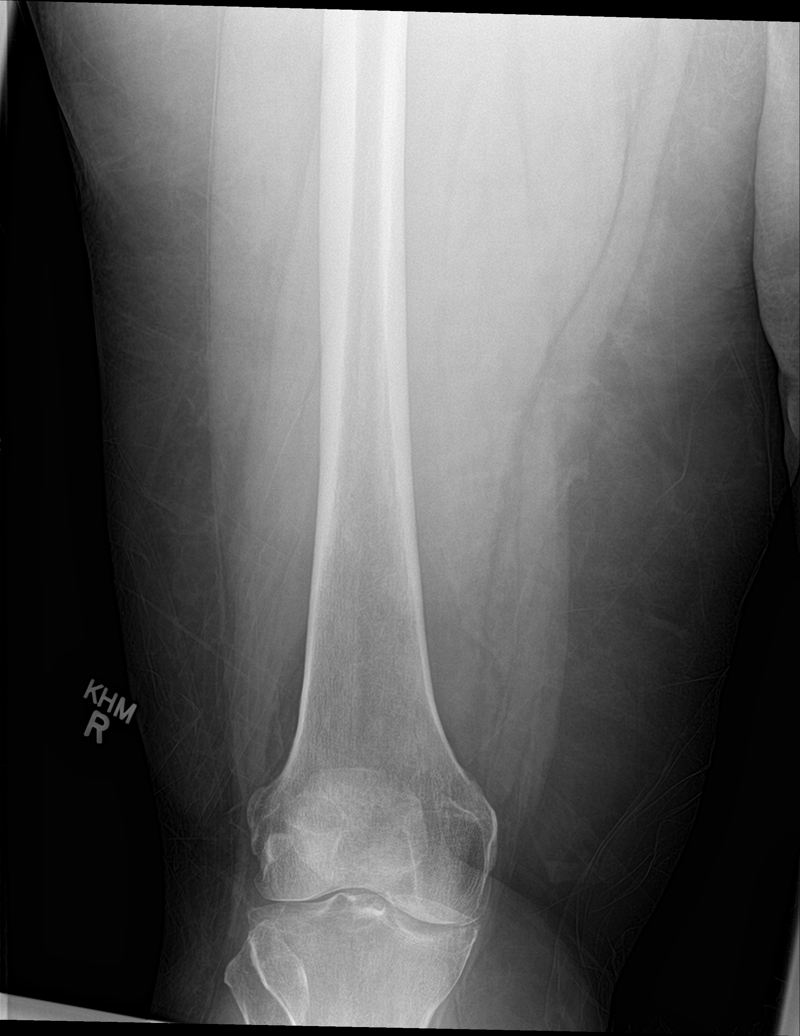

[femur lat (1 of 3)]
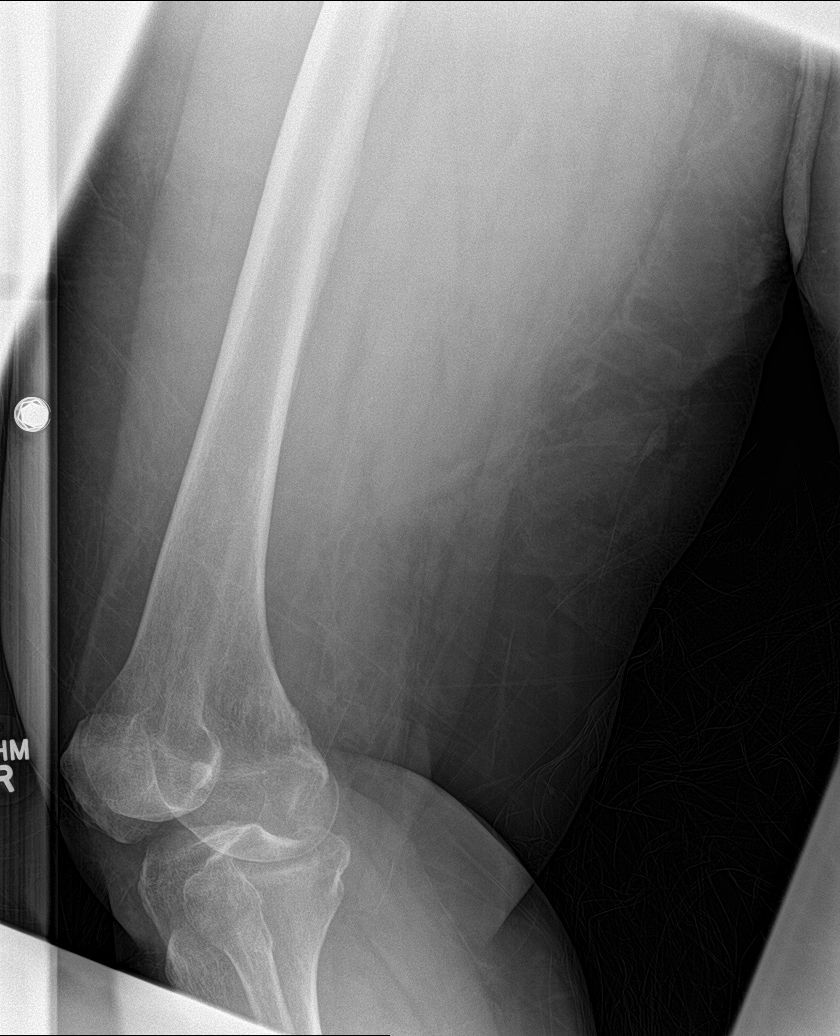

[femur lat (2 of 3)]
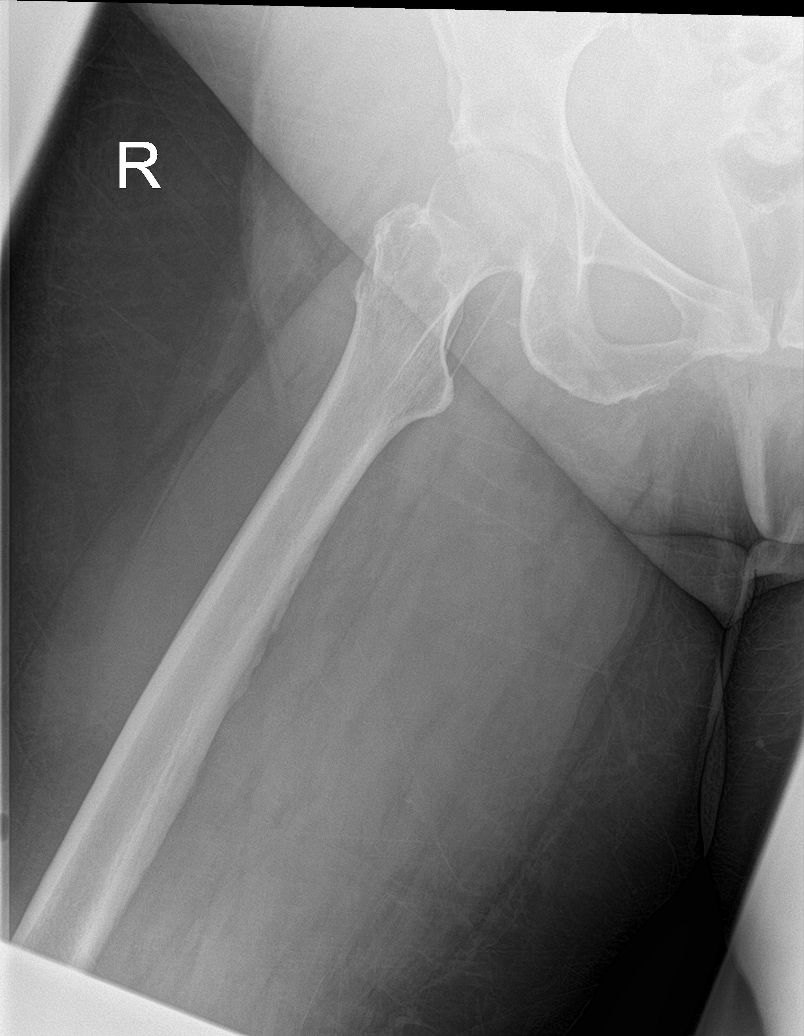

[femur lat (3 of 3)]
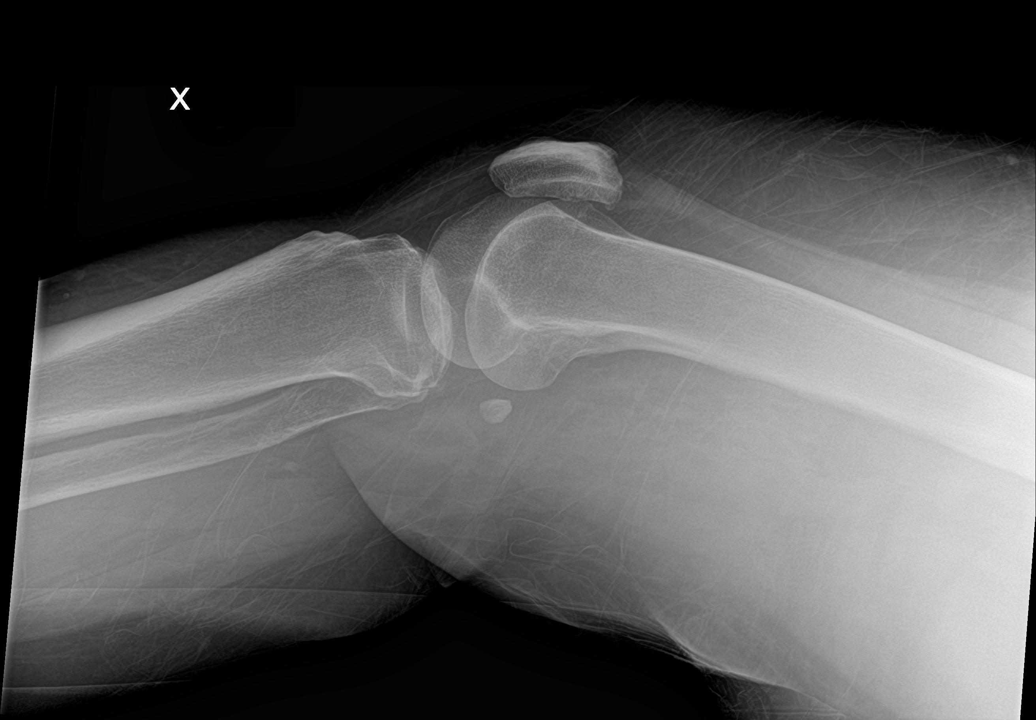

[5 of 5 positions shown; findings below may reference images not displayed]

FINDINGS: No fracture or dislocation of the pelvis in single frontal view. No
fracture of the hips. Mild hip joint arthrosis.

No fracture or dislocation of the right femur.

No acute fracture or dislocation of the right ankle. There is plate
and screw fixation of the distal right fibula with syndesmosis
tightrope repair. Severe posttraumatic arthrosis of the right ankle
mortise.
IMPRESSION: 1. No fracture or dislocation of the pelvis in single frontal view.
No fracture of the hips. Mild hip joint arthrosis.

2.  No fracture or dislocation of the right femur.

3. No acute fracture or dislocation of the right ankle. There is
plate and screw fixation of the distal right fibula with syndesmosis
tightrope repair. Severe posttraumatic arthrosis of the right ankle
mortise.

## 2020-09-24 IMAGING — DX RIGHT ANKLE - COMPLETE 3+ VIEW
3 series · 3 of 3 positions shown · non-contrast
Comparison: None.

CLINICAL DATA: Fall, pain

EXAM:
RIGHT ANKLE - COMPLETE 3+ VIEW; RIGHT FEMUR 2 VIEWS; PELVIS - 1-2
VIEW

[ankle ap]
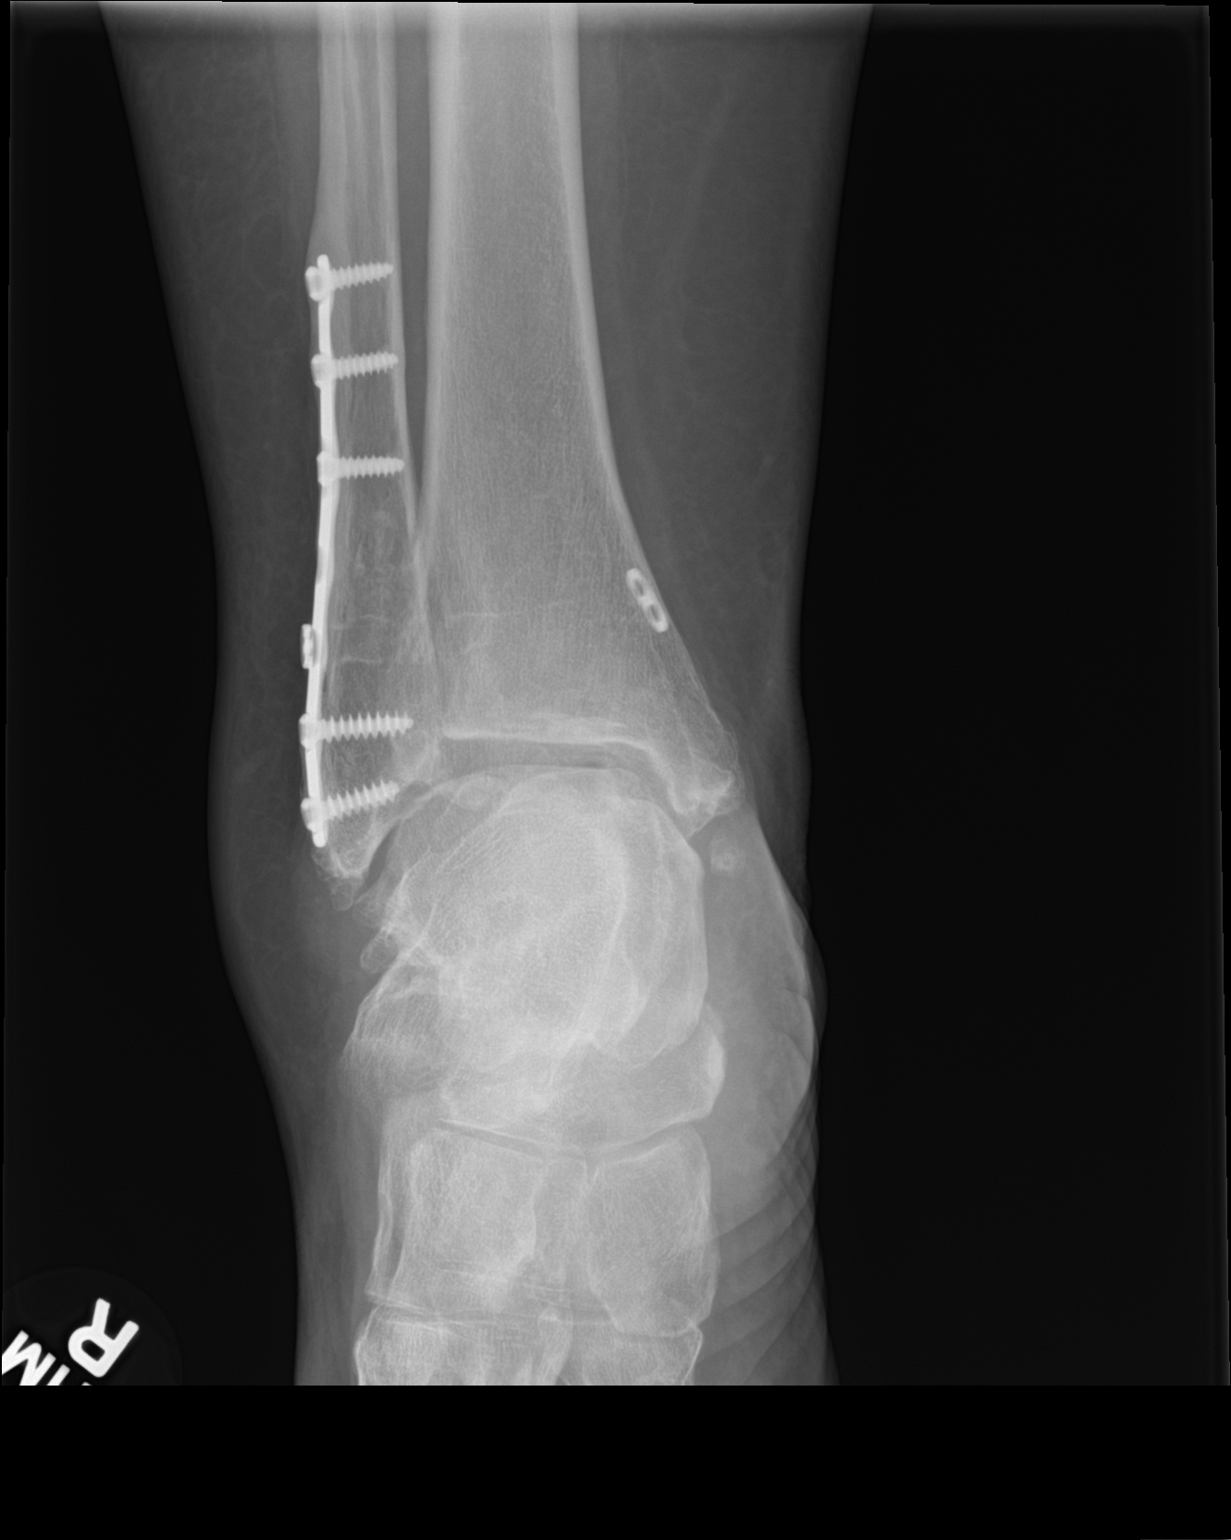

[ankle obl]
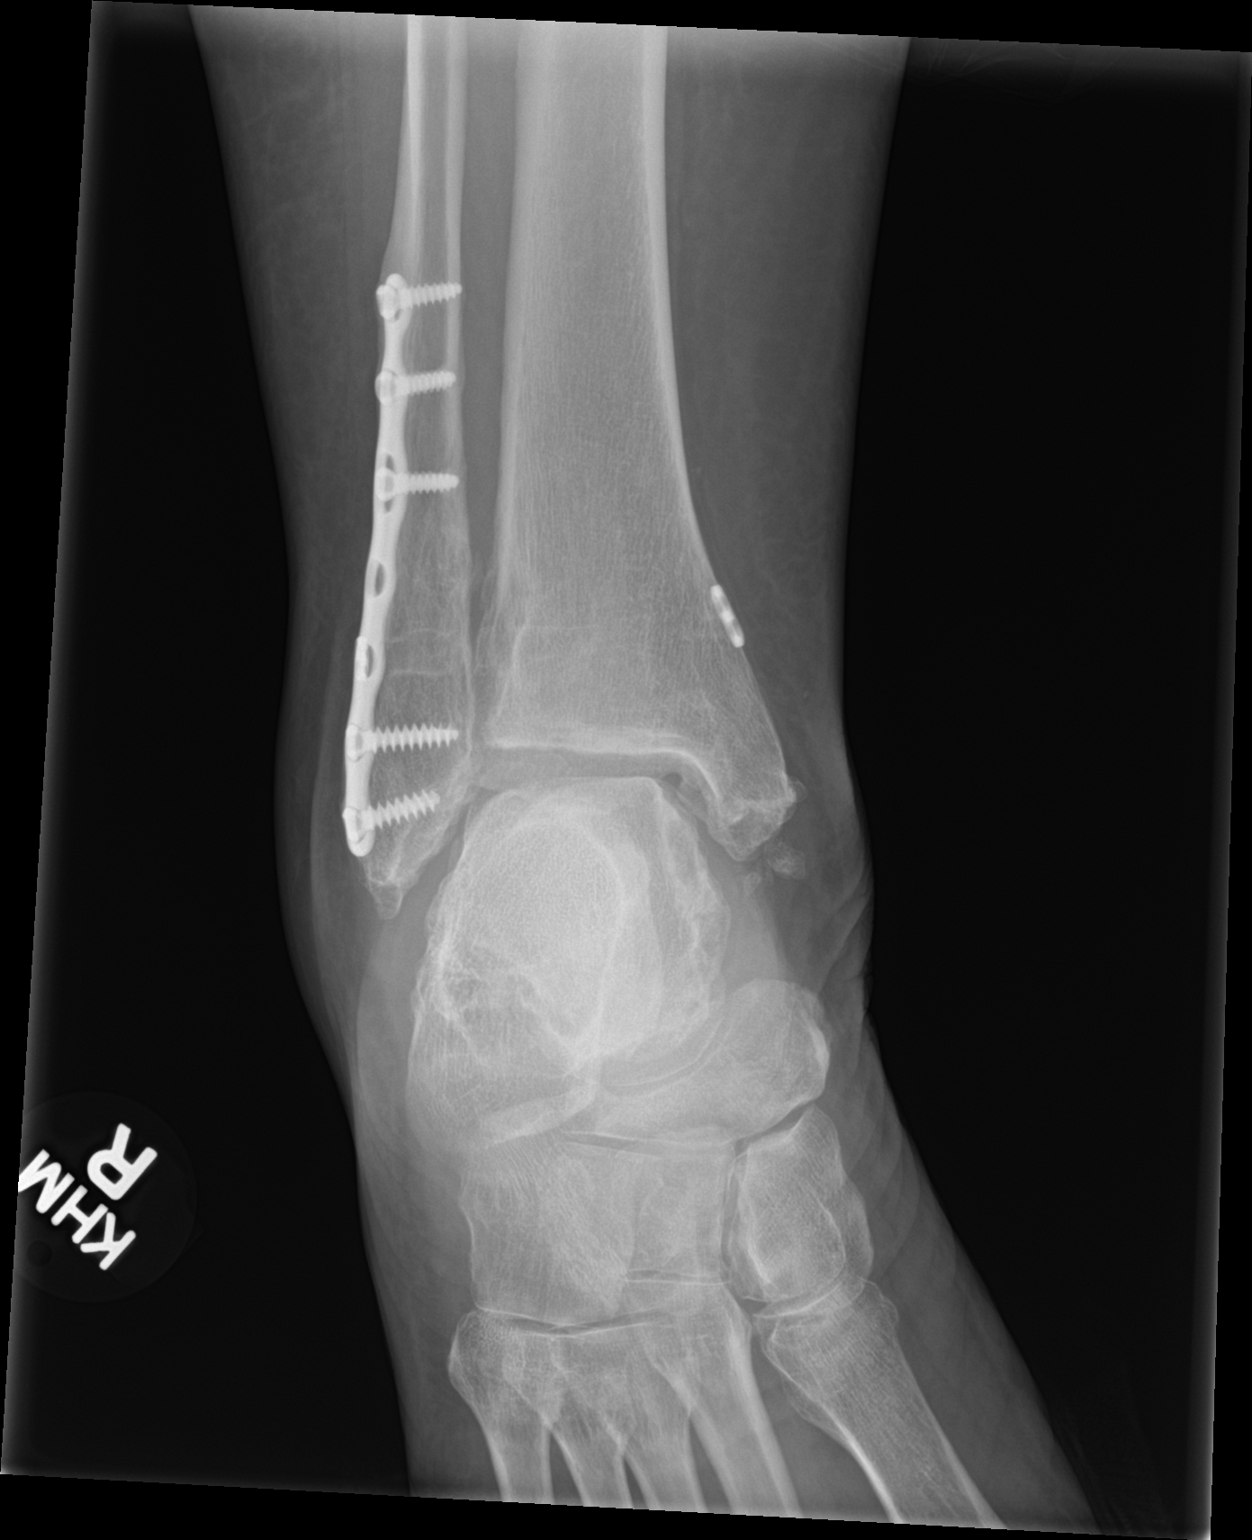

[ankle lat]
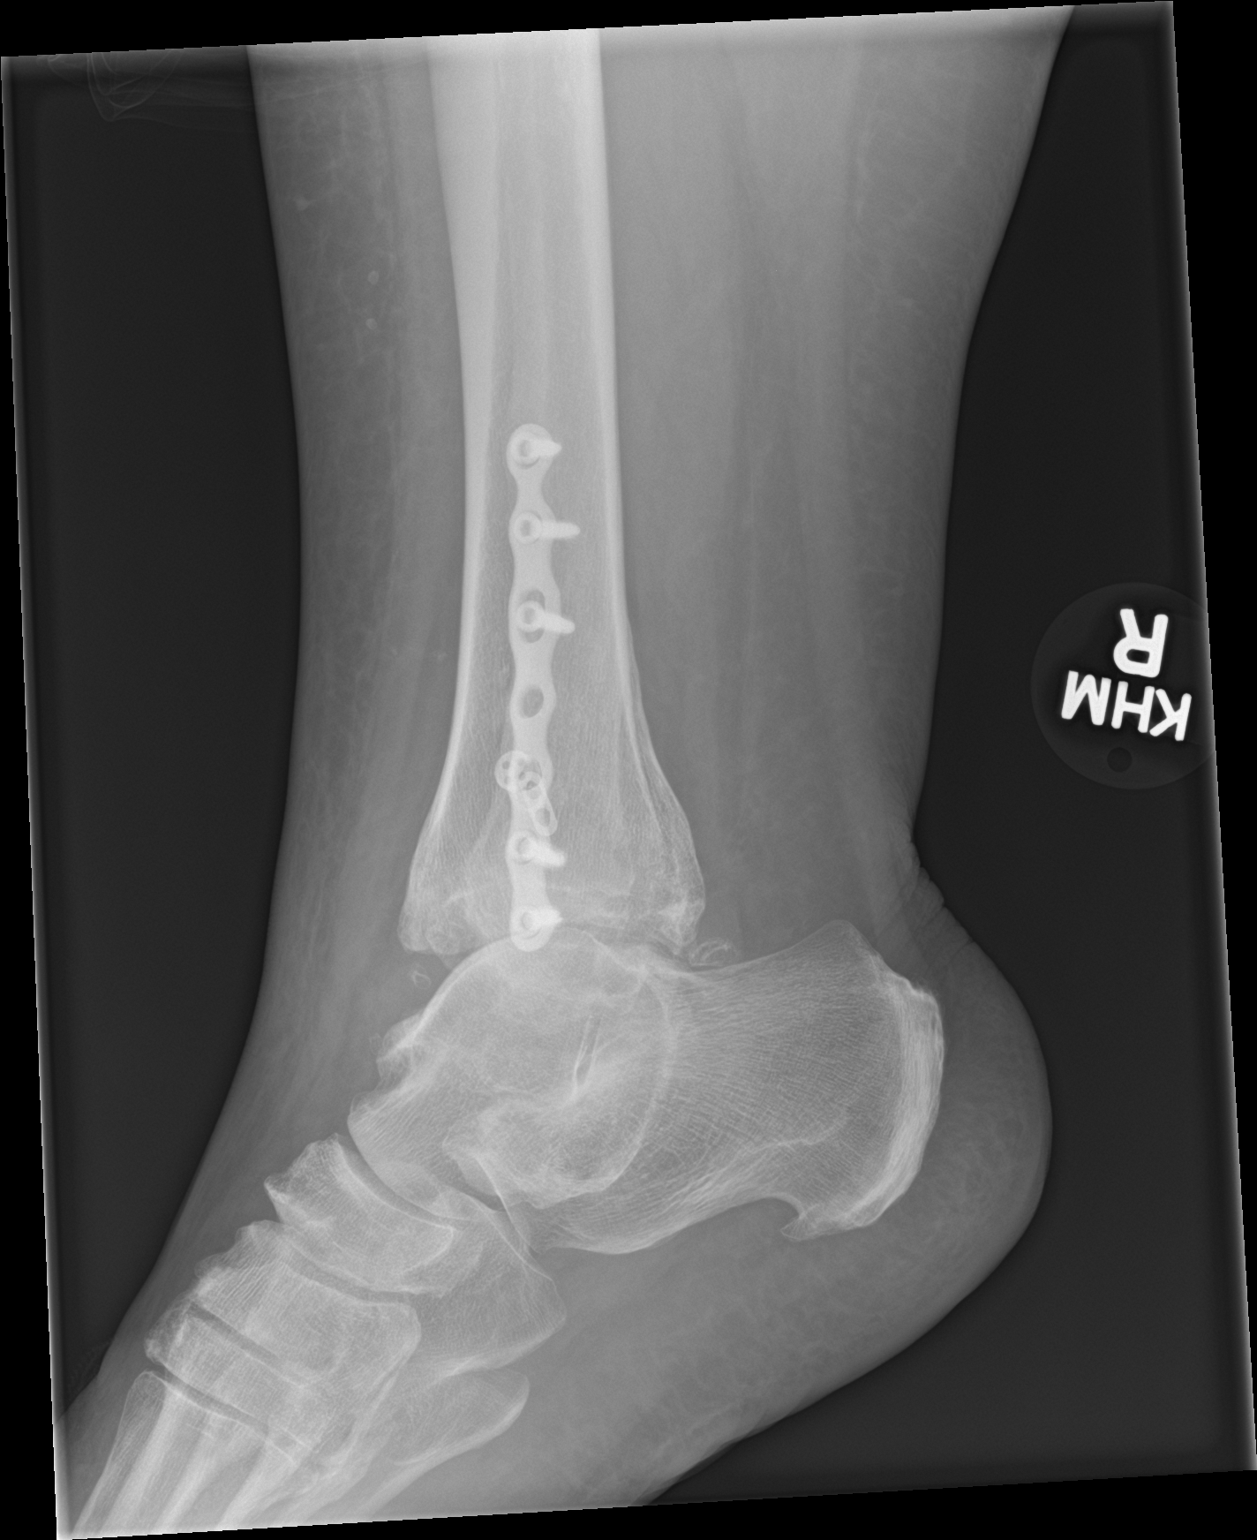

[3 of 3 positions shown; findings below may reference images not displayed]

FINDINGS: No fracture or dislocation of the pelvis in single frontal view. No
fracture of the hips. Mild hip joint arthrosis.

No fracture or dislocation of the right femur.

No acute fracture or dislocation of the right ankle. There is plate
and screw fixation of the distal right fibula with syndesmosis
tightrope repair. Severe posttraumatic arthrosis of the right ankle
mortise.
IMPRESSION: 1. No fracture or dislocation of the pelvis in single frontal view.
No fracture of the hips. Mild hip joint arthrosis.

2.  No fracture or dislocation of the right femur.

3. No acute fracture or dislocation of the right ankle. There is
plate and screw fixation of the distal right fibula with syndesmosis
tightrope repair. Severe posttraumatic arthrosis of the right ankle
mortise.

## 2020-09-30 ENCOUNTER — Ambulatory Visit: Payer: Medicare Other | Admitting: Cardiology

## 2020-10-09 DIAGNOSIS — I10 Essential (primary) hypertension: Secondary | ICD-10-CM | POA: Diagnosis not present

## 2020-10-09 DIAGNOSIS — E7800 Pure hypercholesterolemia, unspecified: Secondary | ICD-10-CM | POA: Diagnosis not present

## 2020-10-09 DIAGNOSIS — J4520 Mild intermittent asthma, uncomplicated: Secondary | ICD-10-CM | POA: Diagnosis not present

## 2020-10-09 DIAGNOSIS — E1169 Type 2 diabetes mellitus with other specified complication: Secondary | ICD-10-CM | POA: Diagnosis not present

## 2020-10-09 DIAGNOSIS — E6609 Other obesity due to excess calories: Secondary | ICD-10-CM | POA: Diagnosis not present

## 2020-10-09 DIAGNOSIS — Z0000 Encounter for general adult medical examination without abnormal findings: Secondary | ICD-10-CM | POA: Diagnosis not present

## 2020-10-09 DIAGNOSIS — Z1389 Encounter for screening for other disorder: Secondary | ICD-10-CM | POA: Diagnosis not present

## 2020-10-09 DIAGNOSIS — Z1211 Encounter for screening for malignant neoplasm of colon: Secondary | ICD-10-CM | POA: Diagnosis not present

## 2020-10-09 DIAGNOSIS — Z23 Encounter for immunization: Secondary | ICD-10-CM | POA: Diagnosis not present

## 2020-10-14 DIAGNOSIS — K219 Gastro-esophageal reflux disease without esophagitis: Secondary | ICD-10-CM | POA: Diagnosis not present

## 2020-10-14 DIAGNOSIS — I10 Essential (primary) hypertension: Secondary | ICD-10-CM | POA: Diagnosis not present

## 2020-10-14 DIAGNOSIS — E119 Type 2 diabetes mellitus without complications: Secondary | ICD-10-CM | POA: Diagnosis not present

## 2020-10-14 DIAGNOSIS — E7800 Pure hypercholesterolemia, unspecified: Secondary | ICD-10-CM | POA: Diagnosis not present

## 2020-10-14 DIAGNOSIS — J45909 Unspecified asthma, uncomplicated: Secondary | ICD-10-CM | POA: Diagnosis not present

## 2020-10-14 DIAGNOSIS — J4520 Mild intermittent asthma, uncomplicated: Secondary | ICD-10-CM | POA: Diagnosis not present

## 2020-10-14 DIAGNOSIS — E1169 Type 2 diabetes mellitus with other specified complication: Secondary | ICD-10-CM | POA: Diagnosis not present

## 2020-11-17 DIAGNOSIS — I10 Essential (primary) hypertension: Secondary | ICD-10-CM | POA: Diagnosis not present

## 2020-11-17 DIAGNOSIS — E1169 Type 2 diabetes mellitus with other specified complication: Secondary | ICD-10-CM | POA: Diagnosis not present

## 2020-11-17 DIAGNOSIS — E7800 Pure hypercholesterolemia, unspecified: Secondary | ICD-10-CM | POA: Diagnosis not present

## 2020-11-17 DIAGNOSIS — J4520 Mild intermittent asthma, uncomplicated: Secondary | ICD-10-CM | POA: Diagnosis not present

## 2020-12-09 ENCOUNTER — Ambulatory Visit: Payer: Medicare Other | Admitting: Cardiology

## 2020-12-11 ENCOUNTER — Other Ambulatory Visit: Payer: Medicare HMO

## 2020-12-11 DIAGNOSIS — Z1159 Encounter for screening for other viral diseases: Secondary | ICD-10-CM | POA: Diagnosis not present

## 2020-12-19 DIAGNOSIS — J4520 Mild intermittent asthma, uncomplicated: Secondary | ICD-10-CM | POA: Diagnosis not present

## 2020-12-19 DIAGNOSIS — E7800 Pure hypercholesterolemia, unspecified: Secondary | ICD-10-CM | POA: Diagnosis not present

## 2020-12-19 DIAGNOSIS — E1169 Type 2 diabetes mellitus with other specified complication: Secondary | ICD-10-CM | POA: Diagnosis not present

## 2020-12-19 DIAGNOSIS — E119 Type 2 diabetes mellitus without complications: Secondary | ICD-10-CM | POA: Diagnosis not present

## 2020-12-19 DIAGNOSIS — K219 Gastro-esophageal reflux disease without esophagitis: Secondary | ICD-10-CM | POA: Diagnosis not present

## 2020-12-19 DIAGNOSIS — I10 Essential (primary) hypertension: Secondary | ICD-10-CM | POA: Diagnosis not present

## 2020-12-19 DIAGNOSIS — J45909 Unspecified asthma, uncomplicated: Secondary | ICD-10-CM | POA: Diagnosis not present

## 2021-01-13 DIAGNOSIS — M25512 Pain in left shoulder: Secondary | ICD-10-CM | POA: Diagnosis not present

## 2021-01-14 ENCOUNTER — Ambulatory Visit (INDEPENDENT_AMBULATORY_CARE_PROVIDER_SITE_OTHER): Payer: Medicare HMO

## 2021-01-14 ENCOUNTER — Ambulatory Visit (INDEPENDENT_AMBULATORY_CARE_PROVIDER_SITE_OTHER): Payer: Medicare HMO | Admitting: Orthopaedic Surgery

## 2021-01-14 ENCOUNTER — Other Ambulatory Visit: Payer: Self-pay

## 2021-01-14 ENCOUNTER — Encounter: Payer: Self-pay | Admitting: Orthopaedic Surgery

## 2021-01-14 VITALS — Ht 65.0 in | Wt 259.0 lb

## 2021-01-14 DIAGNOSIS — M25512 Pain in left shoulder: Secondary | ICD-10-CM | POA: Diagnosis not present

## 2021-01-14 DIAGNOSIS — M542 Cervicalgia: Secondary | ICD-10-CM

## 2021-01-14 MED ORDER — BUPIVACAINE HCL 0.25 % IJ SOLN
2.0000 mL | INTRAMUSCULAR | Status: AC | PRN
  Administered 2021-01-14: 2 mL via INTRA_ARTICULAR

## 2021-01-14 NOTE — Progress Notes (Signed)
Office Visit Note   Patient: Suzanne Patterson           Date of Birth: Dec 26, 1950           MRN: [id number] Visit Date: 01/14/2021              Requested by: [name], [address] [place],  [place] [address] PCP: [name], MD   Assessment & Plan: Visit Diagnoses:  1. Neck pain   2. Acute pain of left shoulder   3. Cervicalgia     Plan: Acute onset of left shoulder pain after mopping her floor this past Saturday.  Has positive impingement with what I suspect is a bursitis or rotator cuff tendinitis.  Will inject the subacromial space with betamethasone and monitor response.  Also having some mild neck discomfort that seems better after chiropractic manipulation.  X-rays demonstrate some degenerative changes particularly at C5-6.  No neurologic deficits  Follow-Up Instructions: Return if symptoms worsen or fail to improve.   Orders:  Orders Placed This Encounter  Procedures  . Large Joint Inj: L subacromial bursa  . XR Shoulder Left  . XR Cervical Spine 2 or 3 views   No orders of the defined types were placed in this encounter.     Procedures: Large Joint Inj: L subacromial bursa on 01/14/2021 11:46 AM Indications: pain and diagnostic evaluation Details: 25 G 1.5 in needle, anterolateral approach  Arthrogram: No  Medications: 2 mL bupivacaine 0.25 %  12 mg betamethasone injected the subacromial space left shoulder with Marcaine Consent was given by the patient. Immediately prior to procedure a time out was called to verify the correct patient, procedure, equipment, support staff and site/side marked as required. Patient was prepped and draped in the usual sterile fashion.       Clinical Data: No additional findings.   Subjective: Chief Complaint  Patient presents with  . Left Shoulder - Pain  . Neck - Pain  Patient presents today for left shoulder pain. She said that she woke 3 days ago with left shoulder pain. No known  injury. She said that she had mopped her floors the night before. She has very limited range of motion. She has pain all throughout her shoulder and into her proximal arm. She also has complaints of neck pain that has been present for 3 months. She has been experiencing numbness and tingling in her arm since her neck began hurting. She is right hand dominant. She takes tylenol for pain relief. She said that she saw a chiropractor this week and it helped with her neck pain some, but did not change her shoulder pain. She saw her PCP this week and was told that she just had some tendonitis. No previous imaging done. She was given tramadol this week from her PCP, but after reading the possible side effects she decided not to take it.  HPI  Review of Systems   Objective: Vital Signs: Ht 5\' 5"  (1.651 m)   Wt 259 lb (117.5 kg)   BMI 43.10 kg/m   Physical Exam Constitutional:      Appearance: She is well-developed and well-nourished.  HENT:     Mouth/Throat:     Mouth: Oropharynx is clear and moist.  Eyes:     Extraocular Movements: EOM normal.     Pupils: Pupils are equal, round, and reactive to light.  Pulmonary:     Effort: Pulmonary effort is normal.  Skin:    General:  Skin is warm and dry.  Neurological:     Mental Status: She is alert and oriented to person, place, and time.  Psychiatric:        Mood and Affect: Mood and affect normal.        Behavior: Behavior normal.     Ortho Exam awake alert and oriented x3.  Comfortable sitting.  Almost full range of motion of cervical spine with mild left-sided neck discomfort but no referred pain to the left upper extremity.  Positive impingement and empty can testing left shoulder.  Good strength.  No crepitation.  Positive anterior subacromial pain.  No pain at the [facility] joint.  Biceps intact Specialty Comments:  No specialty comments available.  Imaging: XR Cervical Spine 2 or 3 views  Result Date: 01/14/2021 Films of the cervical spine  were obtained in 2 projections.  There is straightening of the normal cervical lordosis.  Degenerative disc disease at C5-6 with narrowing of the joint space and anterior osteophytes.  No listhesis.  No evidence of a fracture  XR Shoulder Left  Result Date: 01/14/2021 Films of the left shoulder obtained and 3 projections.  Humeral head is centered about the glenoid.  Normal space between the humeral head and the acromion.  Some degenerative changes at the [facility] joint but the joint space is not narrowed.  Minimal protuberance of the subacromial region.  No ectopic calcification    PMFS History: Patient Active Problem List   Diagnosis Date Noted  . Pain in left shoulder 01/14/2021  . Cervicalgia 01/14/2021  . Essential hypertension 07/22/2020  . Other forms of angina pectoris ([place]) 07/17/2020  . Precordial pain 07/17/2020  . Effusion, left knee 07/03/2020  . Primary osteoarthritis of left knee 07/03/2020  . Pain in right leg 07/31/2019   Past Medical History:  Diagnosis Date  . Anxiety   . Depression   . DM (diabetes mellitus), type 2 (HCC)    dIET CONTROLLED  . Environmental and seasonal allergies   . GERD (gastroesophageal reflux disease)   . Hyperlipidemia associated with type 2 diabetes mellitus ([place])   . Hypertension   . Uterine polyp     Family History  Family history unknown: Yes    Past Surgical History:  Procedure Laterality Date  . DILATATION & CURETTAGE/HYSTEROSCOPY WITH MYOSURE N/A 06/04/2019   Procedure: DILATATION & CURETTAGE/HYSTEROSCOPY WITH MYOSURE;  Surgeon: Janyth Pupa, DO;  Location: [facility];  Service: Gynecology;  Laterality: N/A;  [place] Myosure Rep will be here.  Confirmed on 05/29/19 CS  . EXCISION OF SKIN TAG Right 06/04/2019   Procedure: EXCISION OF SKIN TAG;  Surgeon: Janyth Pupa, DO;  Location: [facility];  Service: Gynecology;  Laterality: Right;  . FOOT SURGERY Right    Social History   Occupational History  .  Not on file  Tobacco Use  . Smoking status: Never Smoker  . Smokeless tobacco: Never Used  Vaping Use  . Vaping Use: Never used  Substance and Sexual Activity  . Alcohol use: No  . Drug use: No  . Sexual activity: Not on file

## 2021-02-16 DIAGNOSIS — E1169 Type 2 diabetes mellitus with other specified complication: Secondary | ICD-10-CM | POA: Diagnosis not present

## 2021-02-16 DIAGNOSIS — J4520 Mild intermittent asthma, uncomplicated: Secondary | ICD-10-CM | POA: Diagnosis not present

## 2021-02-16 DIAGNOSIS — E7800 Pure hypercholesterolemia, unspecified: Secondary | ICD-10-CM | POA: Diagnosis not present

## 2021-02-16 DIAGNOSIS — E119 Type 2 diabetes mellitus without complications: Secondary | ICD-10-CM | POA: Diagnosis not present

## 2021-02-16 DIAGNOSIS — K219 Gastro-esophageal reflux disease without esophagitis: Secondary | ICD-10-CM | POA: Diagnosis not present

## 2021-02-16 DIAGNOSIS — J45909 Unspecified asthma, uncomplicated: Secondary | ICD-10-CM | POA: Diagnosis not present

## 2021-02-16 DIAGNOSIS — I10 Essential (primary) hypertension: Secondary | ICD-10-CM | POA: Diagnosis not present

## 2021-03-06 DIAGNOSIS — I10 Essential (primary) hypertension: Secondary | ICD-10-CM | POA: Diagnosis not present

## 2021-03-06 DIAGNOSIS — K0381 Cracked tooth: Secondary | ICD-10-CM | POA: Diagnosis not present

## 2021-03-06 DIAGNOSIS — R69 Illness, unspecified: Secondary | ICD-10-CM | POA: Diagnosis not present

## 2021-03-11 ENCOUNTER — Other Ambulatory Visit: Payer: Self-pay

## 2021-03-11 ENCOUNTER — Telehealth: Payer: Self-pay | Admitting: Cardiology

## 2021-03-11 ENCOUNTER — Encounter: Payer: Self-pay | Admitting: Cardiology

## 2021-03-11 ENCOUNTER — Ambulatory Visit (INDEPENDENT_AMBULATORY_CARE_PROVIDER_SITE_OTHER): Payer: Medicare HMO | Admitting: Cardiology

## 2021-03-11 DIAGNOSIS — R0789 Other chest pain: Secondary | ICD-10-CM | POA: Diagnosis not present

## 2021-03-11 DIAGNOSIS — R072 Precordial pain: Secondary | ICD-10-CM | POA: Diagnosis not present

## 2021-03-11 DIAGNOSIS — I10 Essential (primary) hypertension: Secondary | ICD-10-CM | POA: Diagnosis not present

## 2021-03-11 MED ORDER — HYDROCHLOROTHIAZIDE 25 MG PO TABS: 25.0000 mg | ORAL_TABLET | Freq: Every day | ORAL | 4 refills | Status: DC

## 2021-03-11 MED ORDER — OLMESARTAN MEDOXOMIL-HCTZ 40-12.5 MG PO TABS: 1.0000 | ORAL_TABLET | Freq: Every day | ORAL | 3 refills | Status: DC

## 2021-03-11 MED ORDER — CARVEDILOL 6.25 MG PO TABS: 6.2500 mg | ORAL_TABLET | Freq: Two times a day (BID) | ORAL | 3 refills | Status: DC

## 2021-03-11 NOTE — Telephone Encounter (Signed)
Patient had an add-on appointment today.

## 2021-03-11 NOTE — Progress Notes (Signed)
None none Primary Care Provider: [name], MD Cardiologist: Glenetta Hew, MD Electrophysiologist: None  Clinic Note: Chief Complaint  Patient presents with  . Hypertension    Blood pressures in the 190s at dentist office, procedure canceled. Was told to contact cardiologist PCP has been holding/adjusting medications because of concern for potential allergies..  . Follow-up    Has not yet returned to discuss results of stress test   ===================================  ASSESSMENT/PLAN   Problem List Items Addressed This Visit    Atypical chest pain    The original plan was to do a coronary CTA with CT FFR, but this was not been covered by her insurance company.  We therefore went with a [place] which was nonischemic.  She is no longer having any chest pain.  For now we will focus on getting her blood pressure controlled, but may consider screening evaluation with coronary CTA to determine how aggressive we need to be with other risk factors such as hyperlipidemia and obesity..      Precordial pain    Negative Myoview stress test.  I suspect the symptoms are probably musculoskeletal in nature.  No longer having the significant symptoms.      Accelerated hypertension    Significant elevated blood pressure today.  Still elevated here today at 173/102 mmHg were higher into the dental office in the 190s.  I think this is probably conglomeration of her being in pain, but also not being on the medications that she had been on before. Developed part of discussion, I was not aware of the fact that she was on a lower dose of carvedilol.  Feels somewhat uncomfortable managing blood pressure medications have been adjusted by her PCP, but she is here now because of high blood pressure.  Not because the original reason was his chest discomfort.  Plan:  We need to gradually get her back up to her full dose of carvedilol, but will do that slowly.  Increase to 6.25 mg twice  daily now.  She still having the mouth swelling so it is not likely to be olmesartan that is causing it.  The concern was possible angioedema which can be discounted at this point. => Restart olmesartan-HCTZ 40/12.5 mg daily; convert HCTZ to as needed only.  For now continue amlodipine at 2.5 mg daily but low threshold to increase that as well.  2-week follow-up at [facility] for further titration of medications. => Goal will be to titrate carvedilol and/or amlodipine       Relevant Medications   carvedilol (COREG) 6.25 MG tablet   olmesartan-hydrochlorothiazide (BENICAR HCT) 40-12.5 MG tablet   hydrochlorothiazide (HYDRODIURIL) 25 MG tablet   Essential hypertension (Chronic)    Blood pressure well controlled while I saw her, she is now on one quarter of the dose of beta-blocker, and not [employer] the ARB.  Not unexpectedly, her blood pressure is higher.  She is somewhat frustrated about this.  Unfortunately now she is back in cardiology office for management of hypertension as opposed to evaluation of chest pain.  Plan is to gradually restart her back on her medications that she was on.  Go back up to one half of the previous carvedilol dose twice daily (625 mg) and restart on losartan-HCTZ.  Change HCTZ 25 mg to PRN for edema.      Relevant Medications   carvedilol (COREG) 6.25 MG tablet   olmesartan-hydrochlorothiazide (BENICAR HCT) 40-12.5 MG tablet   hydrochlorothiazide (HYDRODIURIL) 25 MG tablet      ===================================  HPI:    Suzanne Patterson is a 70 y.o. female with a PMH Hypertension hyperlipidemia and diabetes control by diet, anxiety and depression who presents today for work-in visit because of high blood pressure at dentist office    Suzanne Patterson was last seen on July 17, 2020 complaining of some chest discomfort at rest.  She was seen initially at the request of [name], MD.  she described 4 separate episodes of  chest discomfort, 1 of which took her to the emergency room.  Interestingly, she had been very stressed and pressured.  Her symptoms resolved with Xanax.  Episodes often wake her up from sleep and may be associated with short bursts of palpitations.  Original plan was evaluated with a coronary CT angiogram however, this was not covered by insurance, therefore switched to [employer].  She was post return for 57-month follow-up, and this does not happen.  Recent Hospitalizations: None  Reviewed  CV studies:    The following studies were reviewed today: (if available, images/films reviewed: From Epic Chart or Care Everywhere) . Lexiscan Myoview 08/20/2020: EF 60-70%.  T wave inversion in inferior lateral leads Lexiscan.  Small sized mild severity defect in the mid anterior and anterolateral location that improved with stress.  Not consistent with ischemia.  Felt to be   Related to breast attenuation artifact.   Interval History:   Suzanne Patterson presents today as a work-in visit because of high blood pressure noted at dentist office today.  Readings of 190s/110s.  Noted some mild lightheadedness.  Interestingly, she comes to  cardiologist office as opposed to her PCPs office. Blood pressure was not a major issue when I saw her in clinic.  Her pressures are pretty well controlled on a combination of amlodipine 2.5 mg daily, carvedilol 12.5 mg twice daily, olmesartan-HCTZ 40-25 mg daily.  Her blood pressure was 136/80.  Apparently Suzanne Patterson has been having issues with mouth swelling over the last couple months, but was seen on March 1 for what sounds like a cracked tooth.  She started to her dentist for evaluation.  Unfortunately, her blood pressures were extremely high.  It is quite possible that her mouth swelling was related to her dental history.   In the last month or so, her blood pressures have been up and down, but a lot of it is attributed to the fact that with her complaints of  mouth swelling, her PCP stop the olmesartan portion of olmesartan-HCTZ and increase to 25 mg HCTZ.  She also apparently (which was not clear during initial intake-because she simply said yes that she was taking carvedilol and her husband reminded her that she is taking lower dose) had her dose of carvedilol cut down to 3.125 mg twice daily from 12.5 mg twice daily.    With her blood pressure being high, she is not really having any symptoms of headache or blurred vision dyspnea more than usual for her.  She also has a mild headache.  She has not had any more than usual shortness of breath with exertion, but no more or chest tightness or pressure.  No heart failure symptoms of PND or orthopnea with trivial edema.  No real palpitation symptoms.  CV Review of Symptoms (Summary): positive for - Mild exertional dyspnea, no change from baseline.  Labile blood pressures, but no headache or blurred vision.  Stable mild edema. negative for - chest pain, irregular heartbeat, orthopnea, palpitations, paroxysmal nocturnal dyspnea, rapid heart rate, shortness of breath  or Syncope/near syncope or TIA/amaurosis fugax, claudication  The patient does not have symptoms concerning for COVID-19 infection (fever, chills, cough, or new shortness of breath).   REVIEWED OF SYSTEMS   ROS   malaise/fatigue. Negative for weight loss.  HENT: Negative for congestion and nosebleeds.   Gastrointestinal: Negative for abdominal pain, blood in stool, melena, nausea and vomiting.  Genitourinary: Negative for hematuria.  Musculoskeletal: Positive for joint pain (Had a right ankle fracture the get swollen).  Neurological: Positive for dizziness (Especially if she does not drink enough). Negative for focal weakness.  Psychiatric/Behavioral: Negative for depression (She is sad, but does not seem to be depressed according to her (but does acknowledge some diet changes, insomnia and anhedonia)) and memory loss. The patient is  nervous/anxious (Has been having panic attacks) and has insomnia.    I have reviewed and (if needed) personally updated the patient's problem list, medications, allergies, past medical and surgical history, social and family history.   PAST MEDICAL HISTORY   Past Medical History:  Diagnosis Date  . Anxiety   . Depression   . DM (diabetes mellitus), type 2 (HCC)    dIET CONTROLLED  . Environmental and seasonal allergies   . GERD (gastroesophageal reflux disease)   . Hyperlipidemia associated with type 2 diabetes mellitus ([place])   . Hypertension   . Uterine polyp     PAST SURGICAL HISTORY   Past Surgical History:  Procedure Laterality Date  . DILATATION & CURETTAGE/HYSTEROSCOPY WITH MYOSURE N/A 06/04/2019   Procedure: DILATATION & CURETTAGE/HYSTEROSCOPY WITH MYOSURE;  Surgeon: Janyth Pupa, DO;  Location: [place];  Service: Gynecology;  Laterality: N/A;  [place] Myosure Rep will be here.  Confirmed on 05/29/19 CS  . EXCISION OF SKIN TAG Right 06/04/2019   Procedure: EXCISION OF SKIN TAG;  Surgeon: Janyth Pupa, DO;  Location: [place];  Service: Gynecology;  Laterality: Right;  . FOOT SURGERY Right   . NM MYOVIEW LTD  08/20/2020   Lexiscan Myoview:  EF 60-70%.  T wave inversion in inferior lateral leads Lexiscan.  Small sized mild severity defect in the mid anterior and anterolateral location that improved with stress.  Not consistent with ischemia.  Felt to be   Related to breast attenuation artifact.     There is no immunization history on file for this patient.  MEDICATIONS/ALLERGIES   Current Meds  Medication Sig  . albuterol (VENTOLIN HFA) 108 (90 Base) MCG/ACT inhaler Inhale into the lungs every 6 (six) hours as needed for wheezing or shortness of breath.  . ALPRAZolam (XANAX) 0.5 MG tablet Take 0.5 mg by mouth at bedtime as needed for anxiety.  Marland Kitchen amLODipine (NORVASC) 2.5 MG tablet Take 2.5 mg by mouth daily.  Marland Kitchen aspirin 81 MG chewable  tablet Chew 81 mg by mouth daily.  . carvedilol (COREG) 6.25 MG tablet Take 1 tablet (6.25 mg total) by mouth 2 (two) times daily.  Marland Kitchen co-enzyme Q-10 30 MG capsule Take 30 mg by mouth 3 (three) times daily.  Marland Kitchen escitalopram (LEXAPRO) 5 MG tablet Take 5 mg by mouth daily.  . famotidine (PEPCID) 10 MG tablet Take 10 mg by mouth 2 (two) times daily.  Marland Kitchen olmesartan-hydrochlorothiazide (BENICAR HCT) 40-12.5 MG tablet Take 1 tablet by mouth daily.  . ondansetron (ZOFRAN) 4 MG tablet Take 1 tablet by mouth as needed.  . rosuvastatin (CRESTOR) 5 MG tablet Takes on Monday, Wednesday and fridays  . [DISCONTINUED] carvedilol (COREG) 3.125 MG tablet Take 3.125 mg by  mouth 2 (two) times daily with a meal.  . [DISCONTINUED] hydrochlorothiazide (HYDRODIURIL) 25 MG tablet Take 25 mg by mouth daily.    Allergies  Allergen Reactions  . Iodinated Diagnostic Agents Hives     Per patient notation ( written) 07/23/96 received  130 cc hypaque 60% ( ionic contrast)  reaction - hives  Per  instruction when scheduling  X-rays with contrast   options are- 1) w/o IV, 2) non- ionic,3) pre medicated   . Latex     Itching hives   . Codeine   . Hydrocodone     SOCIAL HISTORY/FAMILY HISTORY   Reviewed in Epic:  Pertinent findings:  Social History   Tobacco Use  . Smoking status: Never Smoker  . Smokeless tobacco: Never Used  Vaping Use  . Vaping Use: Never used  Substance Use Topics  . Alcohol use: No  . Drug use: No   Social History   Social History Narrative   Meredeth and her husband had 5 children, and her basely raising their oldest grandson.  This is the son of their oldest daughter.  There is a very strained relationship between them and their daughter, who does not seem to be appreciative of the efforts they made to take care of her son.  She apparently is very abusive verbally and emotionally to them.  This caused her significant medical distress.  She oftentimes goes to bed upset.    OBJCTIVE -PE,  EKG, labs   Wt Readings from Last 3 Encounters:  03/11/21 250 lb 12.8 oz (113.8 kg)  01/14/21 259 lb (117.5 kg)  08/20/20 263 lb (119.3 kg)    Physical Exam: BP (!) 173/102   Pulse 87   Ht 5\' 4"  (1.626 m)   Wt 250 lb 12.8 oz (113.8 kg)   SpO2 97%   BMI 43.05 kg/m  Physical Exam Vitals reviewed.  Constitutional:      General: She is not in acute distress.    Appearance: She is not ill-appearing or toxic-appearing.     Comments: Morbidly obese, well-groomed.  Just seems upset  HENT:     Head: Normocephalic and atraumatic.  Neck:     Vascular: No carotid bruit, hepatojugular reflux or JVD.  Cardiovascular:     Rate and Rhythm: Normal rate and regular rhythm. Occasional extrasystoles are present.    Chest Wall: PMI is not displaced (Difficult to palpate).     Pulses: Intact distal pulses. Decreased pulses (Mild diminished pedal pulses).     Heart sounds: S1 normal and S2 normal. Heart sounds are distant. No murmur heard. No friction rub. No gallop.   Pulmonary:     Effort: Pulmonary effort is normal. No respiratory distress.     Breath sounds: Normal breath sounds.  Chest:     Chest wall: Tenderness present.  Musculoskeletal:        General: Swelling (Trivial 1+ bilateral ankle) and tenderness (CSM tenderness along the left upper arm) present. Normal range of motion.     Cervical back: Normal range of motion and neck supple.  Skin:    General: Skin is warm and dry.  Neurological:     General: No focal deficit present.     Mental Status: She is alert and oriented to person, place, and time. Mental status is at baseline.  Psychiatric:        Mood and Affect: Mood normal.        Thought Content: Thought content normal.  Judgment: Judgment normal.     Comments: Somewhat anxious, stressed-mood seems somewhat down..  Seems frustrated about having her dental appointment canceled.  Is very upset about her blood pressures being out of control.      Adult ECG Report Not  checked  Recent Labs: Reviewed.  No new labs No results found for: CHOL, HDL, LDLCALC, LDLDIRECT, TRIG, CHOLHDL Lab Results  Component Value Date   CREATININE 0.93 07/07/2020   BUN 21 07/07/2020   NA 137 07/07/2020   K 3.5 07/07/2020   CL 97 (L) 07/07/2020   CO2 27 07/07/2020   CBC Latest Ref Rng & Units 07/07/2020 05/31/2019 12/25/2016  WBC 4.0 - 10.5 K/uL 9.0 7.1 7.9  Hemoglobin 12.0 - 15.0 g/dL 13.3 12.4 12.5  Hematocrit 36.0 - 46.0 % 42.6 38.7 39.9  Platelets 150 - 400 K/uL 300 285 305    No results found for: TSH  ==================================================  COVID-19 Education: The signs and symptoms of COVID-19 were discussed with the patient and how to seek care for testing (follow up with PCP or arrange E-visit).   The importance of social distancing and COVID-19 vaccination was discussed today. The patient is practicing social distancing & Masking.   I spent a total of 78minutes with the patient spent in direct patient consultation.  Additional time spent with chart review  / charting (studies, outside notes, etc): 14 min Total Time: 62 min  Current medicines are reviewed at length with the patient today.  (+/- concerns) n/a  This visit occurred during the SARS-CoV-2 public health emergency.  Safety protocols were in place, including screening questions prior to the visit, additional usage of staff PPE, and extensive cleaning of exam room while observing appropriate contact time as indicated for disinfecting solutions.  Notice: This dictation was prepared with Dragon dictation along with smaller phrase technology. Any transcriptional errors that result from this process are unintentional and may not be corrected upon review.  Patient Instructions / Medication Changes & Studies & Tests Ordered   Patient Instructions  Medication Instructions:    Increase carvedilol to 6.25 mg  ( 2 tablets of 3.125 mg) twice a day   Restart Olmesartan /hctz 40 /12.5 mg once daily  in the morning   Use Hctz 25 mg  As needed for increase swelling    *If you need a refill on your cardiac medications before your next appointment, please call your pharmacy*   Lab Work:  Not needed  Testing/Procedures:  Not needed  Follow-Up: At [facility], you and your health needs are our priority.  As part of our continuing mission to provide you with exceptional heart care, we have created designated Provider Care Teams.  These Care Teams include your primary Cardiologist (physician) and Advanced Practice Providers (APPs -  Physician Assistants and Nurse Practitioners) who all work together to provide you with the care you need, when you need it.     Your next appointment:   2 week(s)  The format for your next appointment:   In Person  Provider:   Your physician recommends that you schedule a follow-up appointment in 2 weeks with CVRR - blood pressure      Studies Ordered:   No orders of the defined types were placed in this encounter.    Glenetta Hew, M.D., M.S. Interventional Cardiologist   Pager # [phone] Phone # [phone] [address] [place], [place] [address]   Thank you for choosing Heartcare at [facility]!!

## 2021-03-11 NOTE — Patient Instructions (Addendum)
Medication Instructions:    Increase carvedilol to 6.25 mg  ( 2 tablets of 3.125 mg) twice a day   Restart Olmesartan /hctz 40 /12.5 mg once daily in the morning   Use Hctz 25 mg  As needed for increase swelling    *If you need a refill on your cardiac medications before your next appointment, please call your pharmacy*   Lab Work:  Not needed  Testing/Procedures:  Not needed  Follow-Up: At [facility], you and your health needs are our priority.  As part of our continuing mission to provide you with exceptional heart care, we have created designated Provider Care Teams.  These Care Teams include your primary Cardiologist (physician) and Advanced Practice Providers (APPs -  Physician Assistants and Nurse Practitioners) who all work together to provide you with the care you need, when you need it.     Your next appointment:   2 week(s)  The format for your next appointment:   In Person  Provider:   Your physician recommends that you schedule a follow-up appointment in 2 weeks with CVRR - blood pressure

## 2021-03-11 NOTE — Telephone Encounter (Signed)
Pt c/o BP issue: STAT if pt c/o blurred vision, one-sided weakness or slurred speech  1. What are your last 5 BP readings?  03/11/21: this morning 197/112 197/120 195/115  2. Are you having any other symptoms (ex. Dizziness, headache, blurred vision, passed out)? Some lightheadedness   3. What is your BP issue? Lawson was at [employer] this morning. She was supposed to get a procedure, but they did not perform it due to her BP and advised she call to schedule. Pt has been put on Dr. Allison Quarry schedule for today, 03/11/21 at 2:40 PM. This appointment was okayed per [place].

## 2021-03-13 ENCOUNTER — Encounter: Payer: Self-pay | Admitting: Cardiology

## 2021-03-13 DIAGNOSIS — I10 Essential (primary) hypertension: Secondary | ICD-10-CM | POA: Insufficient documentation

## 2021-03-13 NOTE — Assessment & Plan Note (Signed)
Blood pressure well controlled while I saw her, she is now on one quarter of the dose of beta-blocker, and not [employer] the ARB.  Not unexpectedly, her blood pressure is higher.  She is somewhat frustrated about this.  Unfortunately now she is back in cardiology office for management of hypertension as opposed to evaluation of chest pain.  Plan is to gradually restart her back on her medications that she was on.  Go back up to one half of the previous carvedilol dose twice daily (625 mg) and restart on losartan-HCTZ.  Change HCTZ 25 mg to PRN for edema.

## 2021-03-13 NOTE — Assessment & Plan Note (Signed)
Significant elevated blood pressure today.  Still elevated here today at 173/102 mmHg were higher into the dental office in the 190s.  I think this is probably conglomeration of her being in pain, but also not being on the medications that she had been on before. Developed part of discussion, I was not aware of the fact that she was on a lower dose of carvedilol.  Feels somewhat uncomfortable managing blood pressure medications have been adjusted by her PCP, but she is here now because of high blood pressure.  Not because the original reason was his chest discomfort.  Plan:  We need to gradually get her back up to her full dose of carvedilol, but will do that slowly.  Increase to 6.25 mg twice daily now.  She still having the mouth swelling so it is not likely to be olmesartan that is causing it.  The concern was possible angioedema which can be discounted at this point. => Restart olmesartan-HCTZ 40/12.5 mg daily; convert HCTZ to as needed only.  For now continue amlodipine at 2.5 mg daily but low threshold to increase that as well.  2-week follow-up at [facility] for further titration of medications. => Goal will be to titrate carvedilol and/or amlodipine

## 2021-03-13 NOTE — Assessment & Plan Note (Signed)
The original plan was to do a coronary CTA with CT FFR, but this was not been covered by her insurance company.  We therefore went with a [place] which was nonischemic.  She is no longer having any chest pain.  For now we will focus on getting her blood pressure controlled, but may consider screening evaluation with coronary CTA to determine how aggressive we need to be with other risk factors such as hyperlipidemia and obesity.Marland Kitchen

## 2021-03-13 NOTE — Assessment & Plan Note (Signed)
Negative Myoview stress test.  I suspect the symptoms are probably musculoskeletal in nature.  No longer having the significant symptoms.

## 2021-03-18 DIAGNOSIS — I10 Essential (primary) hypertension: Secondary | ICD-10-CM | POA: Diagnosis not present

## 2021-03-18 DIAGNOSIS — J45909 Unspecified asthma, uncomplicated: Secondary | ICD-10-CM | POA: Diagnosis not present

## 2021-03-18 DIAGNOSIS — E7800 Pure hypercholesterolemia, unspecified: Secondary | ICD-10-CM | POA: Diagnosis not present

## 2021-03-18 DIAGNOSIS — J4520 Mild intermittent asthma, uncomplicated: Secondary | ICD-10-CM | POA: Diagnosis not present

## 2021-03-18 DIAGNOSIS — E119 Type 2 diabetes mellitus without complications: Secondary | ICD-10-CM | POA: Diagnosis not present

## 2021-03-18 DIAGNOSIS — E1169 Type 2 diabetes mellitus with other specified complication: Secondary | ICD-10-CM | POA: Diagnosis not present

## 2021-03-18 DIAGNOSIS — K219 Gastro-esophageal reflux disease without esophagitis: Secondary | ICD-10-CM | POA: Diagnosis not present

## 2021-04-07 ENCOUNTER — Ambulatory Visit: Payer: Medicare HMO

## 2021-04-07 NOTE — Progress Notes (Deleted)
     04/07/2021 Suzanne Patterson [birth year] [id number]   HPI:  Suzanne Patterson is a 70 y.o. female patient of Dr Ellyn Hack, with a PMH below who presents today for hypertension clinic evaluation.  Past Medical History:                   Blood Pressure Goal:  130/80  Current Medications:  Family Hx:  Social Hx:   Diet:   Exercise:   Home BP readings:   Intolerances:   Labs:    Wt Readings from Last 3 Encounters:  03/11/21 250 lb 12.8 oz (113.8 kg)  01/14/21 259 lb (117.5 kg)  08/20/20 263 lb (119.3 kg)   BP Readings from Last 3 Encounters:  03/11/21 (!) 173/102  07/17/20 136/80  07/07/20 (!) 175/89   Pulse Readings from Last 3 Encounters:  03/11/21 87  07/17/20 (!) 57  07/07/20 61    Current Outpatient Medications  Medication Sig Dispense Refill  . albuterol (VENTOLIN HFA) 108 (90 Base) MCG/ACT inhaler Inhale into the lungs every 6 (six) hours as needed for wheezing or shortness of breath.    . ALPRAZolam (XANAX) 0.5 MG tablet Take 0.5 mg by mouth at bedtime as needed for anxiety.    Marland Kitchen amLODipine (NORVASC) 2.5 MG tablet Take 2.5 mg by mouth daily.    Marland Kitchen aspirin 81 MG chewable tablet Chew 81 mg by mouth daily.    . carvedilol (COREG) 6.25 MG tablet Take 1 tablet (6.25 mg total) by mouth 2 (two) times daily. 180 tablet 3  . co-enzyme Q-10 30 MG capsule Take 30 mg by mouth 3 (three) times daily.    Marland Kitchen escitalopram (LEXAPRO) 5 MG tablet Take 5 mg by mouth daily.  1  . famotidine (PEPCID) 10 MG tablet Take 10 mg by mouth 2 (two) times daily.    . hydrochlorothiazide (HYDRODIURIL) 25 MG tablet Take 1 tablet (25 mg total) by mouth daily. As needed for swelling 30 tablet 4  . olmesartan-hydrochlorothiazide (BENICAR HCT) 40-12.5 MG tablet Take 1 tablet by mouth daily. 90 tablet 3  . ondansetron (ZOFRAN) 4 MG tablet Take 1 tablet by mouth as needed.    . rosuvastatin (CRESTOR) 5 MG tablet Takes on Monday, Wednesday and fridays     No current  facility-administered medications for this visit.    Allergies  Allergen Reactions  . Iodinated Diagnostic Agents Hives     Per patient notation ( written) 07/23/96 received  130 cc hypaque 60% ( ionic contrast)  reaction - hives  Per  instruction when scheduling  X-rays with contrast   options are- 1) w/o IV, 2) non- ionic,3) pre medicated   . Latex     Itching hives   . Codeine   . Hydrocodone     Past Medical History:  Diagnosis Date  . Anxiety   . Depression   . DM (diabetes mellitus), type 2 (HCC)    dIET CONTROLLED  . Environmental and seasonal allergies   . GERD (gastroesophageal reflux disease)   . Hyperlipidemia associated with type 2 diabetes mellitus ([place])   . Hypertension   . Uterine polyp     There were no vitals taken for this visit.  No problem-specific Assessment & Plan notes found for this encounter.   Tommy Medal PharmD CPP [facility] [address] [place], [place] [address] [phone]

## 2021-04-14 DIAGNOSIS — E7800 Pure hypercholesterolemia, unspecified: Secondary | ICD-10-CM | POA: Diagnosis not present

## 2021-04-14 DIAGNOSIS — E1169 Type 2 diabetes mellitus with other specified complication: Secondary | ICD-10-CM | POA: Diagnosis not present

## 2021-04-14 DIAGNOSIS — I10 Essential (primary) hypertension: Secondary | ICD-10-CM | POA: Diagnosis not present

## 2021-04-14 DIAGNOSIS — J45909 Unspecified asthma, uncomplicated: Secondary | ICD-10-CM | POA: Diagnosis not present

## 2021-04-14 DIAGNOSIS — K219 Gastro-esophageal reflux disease without esophagitis: Secondary | ICD-10-CM | POA: Diagnosis not present

## 2021-04-14 DIAGNOSIS — E119 Type 2 diabetes mellitus without complications: Secondary | ICD-10-CM | POA: Diagnosis not present

## 2021-04-14 DIAGNOSIS — J4520 Mild intermittent asthma, uncomplicated: Secondary | ICD-10-CM | POA: Diagnosis not present

## 2021-04-24 ENCOUNTER — Ambulatory Visit: Payer: Medicare HMO

## 2021-05-15 DIAGNOSIS — K219 Gastro-esophageal reflux disease without esophagitis: Secondary | ICD-10-CM | POA: Diagnosis not present

## 2021-05-15 DIAGNOSIS — E7800 Pure hypercholesterolemia, unspecified: Secondary | ICD-10-CM | POA: Diagnosis not present

## 2021-05-15 DIAGNOSIS — J45909 Unspecified asthma, uncomplicated: Secondary | ICD-10-CM | POA: Diagnosis not present

## 2021-05-15 DIAGNOSIS — I10 Essential (primary) hypertension: Secondary | ICD-10-CM | POA: Diagnosis not present

## 2021-05-15 DIAGNOSIS — J4520 Mild intermittent asthma, uncomplicated: Secondary | ICD-10-CM | POA: Diagnosis not present

## 2021-05-15 DIAGNOSIS — E1169 Type 2 diabetes mellitus with other specified complication: Secondary | ICD-10-CM | POA: Diagnosis not present

## 2021-05-15 DIAGNOSIS — E119 Type 2 diabetes mellitus without complications: Secondary | ICD-10-CM | POA: Diagnosis not present

## 2021-05-21 ENCOUNTER — Ambulatory Visit: Payer: Medicare HMO

## 2021-06-18 ENCOUNTER — Ambulatory Visit: Payer: Medicare HMO

## 2021-07-16 ENCOUNTER — Ambulatory Visit (INDEPENDENT_AMBULATORY_CARE_PROVIDER_SITE_OTHER): Payer: Medicare HMO | Admitting: Pharmacist Clinician (PhC)/ Clinical Pharmacy Specialist

## 2021-07-16 ENCOUNTER — Telehealth: Payer: Self-pay | Admitting: Cardiology

## 2021-07-16 ENCOUNTER — Other Ambulatory Visit: Payer: Self-pay

## 2021-07-16 VITALS — BP 150/92 | HR 84 | Resp 16 | Ht 65.0 in | Wt 265.0 lb

## 2021-07-16 DIAGNOSIS — E118 Type 2 diabetes mellitus with unspecified complications: Secondary | ICD-10-CM | POA: Diagnosis not present

## 2021-07-16 DIAGNOSIS — E1169 Type 2 diabetes mellitus with other specified complication: Secondary | ICD-10-CM | POA: Diagnosis not present

## 2021-07-16 DIAGNOSIS — E785 Hyperlipidemia, unspecified: Secondary | ICD-10-CM

## 2021-07-16 DIAGNOSIS — I10 Essential (primary) hypertension: Secondary | ICD-10-CM | POA: Diagnosis not present

## 2021-07-16 MED ORDER — OLMESARTAN MEDOXOMIL 40 MG PO TABS: 40.0000 mg | ORAL_TABLET | Freq: Every day | ORAL | 3 refills | Status: DC

## 2021-07-16 MED ORDER — CHLORTHALIDONE 25 MG PO TABS: 25.0000 mg | ORAL_TABLET | Freq: Every day | ORAL | 3 refills | Status: DC

## 2021-07-16 NOTE — Progress Notes (Signed)
07/17/2021 Jiovanna Frei 1951-04-19 [id number]   HPI:  Suzanne Patterson is a 70 y.o. female patient of Dr Ellyn Hack, who presents today for hypertension and lipid clinic evaluation. In addition to these, her medical history is significant for DM2 (A1c today at 7.5).  She went to her dentist earlier this year to have some work done, but they noted her blood pressure to be extremely elevated and refused to do any treatments until she was better controlled.  She saw Dr. Ellyn Hack a month or so later, at which time her pressure was still elevated at 173/102.  He increased carvedilol to 6.25 mg and re-started her olmesartan hct 40/12.5.    She comes in today for follow up.  Has not noticed much improvement in home BP readings.  She also is complaining about fatigue, muscle aches and joint pain.  She believes it is from the rosuvastatin and has decreased her dose to every other day, without any significant change.  She is unable to exercise or even walk around much because of this.    She is very frustrated that her medications don't seem to be getting her pressure down and wonders what the point of taking all this medication is, if nothing is getting any better.  She also would like to be released to get the dental work done, as it is impacting her ability to chew foods.      Blood Pressure Goal:  130/80  Current Medications: amlodipine 2.5 mg qd (pm)  carvedilol 6.25 mg bid, olmesartan/hctz 40/12.5 qd (pm), hctz 25 prn edema (on ly 3 times since May visit with [facility])  Family Hx:  parents died in auto accisdent when 31 and 65; no issues to that point in life; pgm - chf in her 74's; 7 siblings - 2 brothers with strokes, both still living (1 parapalegic); several with hypertension  Social Hx: no tobacco, rare O'Doules beer, all tea and coffee decaf  Diet: very cautious about diet - no added salt,  proteins include fish and chicken, rare pork or beef;  vegetables fresh and frozen; chili  regularly, likes beans; snacks are mostly fruit; unsalted trail mix (home made)  Exercise: unable to do anything at this time  Home BP readings: 5+ years Omron 12 readings average 153/110 with range 135-174/90-139  Intolerances: higher doses amloidpine  Labs:  1/21 SCr 0.93; K 4.2  Wt Readings from Last 3 Encounters:  07/16/21 265 lb (120.2 kg)  03/11/21 250 lb 12.8 oz (113.8 kg)  01/14/21 259 lb (117.5 kg)   BP Readings from Last 3 Encounters:  07/16/21 (!) 150/92  03/11/21 (!) 173/102  07/17/20 136/80   Pulse Readings from Last 3 Encounters:  07/16/21 84  03/11/21 87  07/17/20 (!) 57    Current Outpatient Medications  Medication Sig Dispense Refill   acetaminophen (TYLENOL) 650 MG CR tablet Take 1 tablet by mouth as needed.     albuterol (VENTOLIN HFA) 108 (90 Base) MCG/ACT inhaler Inhale into the lungs every 6 (six) hours as needed for wheezing or shortness of breath.     ALPRAZolam (XANAX) 0.5 MG tablet Take 0.5 mg by mouth at bedtime as needed for anxiety.     amLODipine (NORVASC) 2.5 MG tablet Take 2.5 mg by mouth daily.     Blood Glucose Calibration (ACCU-CHEK AVIVA) SOLN Apply 1 applicator topically See admin instructions.     Blood Glucose Monitoring Suppl (ACCU-CHEK AVIVA PLUS) w/Device KIT Apply 1 applicator topically See admin  instructions.     carvedilol (COREG) 6.25 MG tablet Take 1 tablet (6.25 mg total) by mouth 2 (two) times daily. 180 tablet 3   chlorthalidone (HYGROTON) 25 MG tablet Take 1 tablet (25 mg total) by mouth daily. 30 tablet 3   co-enzyme Q-10 30 MG capsule Take 30 mg by mouth 3 (three) times daily.     escitalopram (LEXAPRO) 5 MG tablet Take 5 mg by mouth daily.  1   ezetimibe (ZETIA) 10 MG tablet Take 10 mg by mouth daily.     famotidine (PEPCID) 10 MG tablet Take 10 mg by mouth 2 (two) times daily.     FLOVENT DISKUS 50 MCG/BLIST diskus inhaler Inhale 1 puff into the lungs as needed.     fluconazole (DIFLUCAN) 150 MG tablet Take 150 mg by  mouth once.     ibuprofen (ADVIL) 200 MG tablet Take 1 tablet by mouth as needed.     olmesartan (BENICAR) 40 MG tablet Take 1 tablet (40 mg total) by mouth daily. 30 tablet 3   ondansetron (ZOFRAN) 4 MG tablet Take 1 tablet by mouth as needed.     Probiotic Product (PROBIOTIC ADVANCED PO) Take by mouth.     rosuvastatin (CRESTOR) 5 MG tablet Takes on Monday, Wednesday and fridays     aspirin 81 MG chewable tablet Chew 81 mg by mouth daily. (Patient not taking: Reported on 07/16/2021)     No current facility-administered medications for this visit.    Allergies  Allergen Reactions   Iodinated Diagnostic Agents Hives     Per patient notation ( written) 07/23/96 received  130 cc hypaque 60% ( ionic contrast)  reaction - hives  Per  instruction when scheduling  X-rays with contrast   options are- 1) w/o IV, 2) non- ionic,3) pre medicated    Latex     Itching hives    Amlodipine Besylate     Other reaction(s): swelling (ok with 2.5)   Amoxicillin     Other reaction(s): hives   Cephalexin     Other reaction(s): yeast infection   Codeine    Hydrocodone    Olmesartan     Other reaction(s): tongue swelling?    Past Medical History:  Diagnosis Date   Anxiety    Depression    DM (diabetes mellitus), type 2 (HCC)    dIET CONTROLLED   Environmental and seasonal allergies    GERD (gastroesophageal reflux disease)    Hyperlipidemia associated with type 2 diabetes mellitus (HCC)    Hypertension    Uterine polyp     Blood pressure (!) 150/92, pulse 84, resp. rate 16, height $RemoveBe'5\' 5"'KTEHvTNag$  (1.651 m), weight 265 lb (120.2 kg), SpO2 92 %.  Essential hypertension Patient with resistant hypertension, currently frustrated with lack of improvement.  She has only used the prn hydrochlorothiazide 3 times since her appointment with Dr. Ellyn Hack 3 months ago.  I have asked that she discontinue this completely.  Will divide up her olmesartan hct tablet and have her instead take olmesartan 40 mg once daily,  chlorthalidone 25 mg once daily, and continue with the carvedilol and amlodipine.  Since she cannot tolerate higher than 2.5 mg of amlodipine, we can consider removing this completely in the future if able.  Her kidney function and potassium levels are excellent, there is no reason that we can't try adding spironolactone at her next visit if all is well.  We also have a little room to increase carvedilol as her heart rate runs  between 65-75.  I will see her back in 3 weeks for follow up.   Hyperlipidemia associated with type 2 diabetes mellitus ([place]) Patient with excellent LDL today at 62, having been on rosuvastatin 5 mg daily or every other day for the past few months.  Will have her stop completely however, to determine if this is cause of her myalgias and joint pains.  Advised that if symptoms resolve, she should start back on 5 mg just twice weekly (mondays and fridays), to see if this dose would be more tolerable.  Will continue to monitor at follow up appointments.     Tommy Medal PharmD CPP [facility] [address] [place], [place] [address] [phone]

## 2021-07-16 NOTE — Patient Instructions (Signed)
Return for a a follow up appointment August 30 at 11 am  Go to the lab today   Check your blood pressure at home daily and keep record of the readings.  Take your BP meds as follows:  Stop rosuvastatin completely for 10 days.  Restart at just 1 tablet twice weekly (Mondays and Fridays) with the first dose on August 22  Stop olmesartan hct 40/12.5 mg  Start olmesartan 40 mg once daily  Start chlorthalidone 25 mg once daily   Bring all of your meds, your BP cuff and your record of home blood pressures to your next appointment.  Exercise as you're able, try to walk approximately 30 minutes per day.  Keep salt intake to a minimum, especially watch canned and prepared boxed foods.  Eat more fresh fruits and vegetables and fewer canned items.  Avoid eating in fast food restaurants.    HOW TO TAKE YOUR BLOOD PRESSURE: Rest 5 minutes before taking your blood pressure.  Don't smoke or drink caffeinated beverages for at least 30 minutes before. Take your blood pressure before (not after) you eat. Sit comfortably with your back supported and both feet on the floor (don't cross your legs). Elevate your arm to heart level on a table or a desk. Use the proper sized cuff. It should fit smoothly and snugly around your bare upper arm. There should be enough room to slip a fingertip under the cuff. The bottom edge of the cuff should be 1 inch above the crease of the elbow. Ideally, take 3 measurements at one sitting and record the average.

## 2021-07-16 NOTE — Telephone Encounter (Signed)
*  STAT* If patient is at the pharmacy, call can be transferred to refill team.   1. Which medications need to be refilled? (please list name of each medication and dose if known)  ALPRAZolam (XANAX) 0.5 MG tablet  ondansetron (ZOFRAN) 4 MG tablet  2. Which pharmacy/location (including street and city if local pharmacy) is medication to be sent to? [place], [place] - [id number]  3. Do they need a 30 day or 90 day supply? 30 day supply

## 2021-07-17 ENCOUNTER — Encounter: Payer: Self-pay | Admitting: Pharmacist Clinician (PhC)/ Clinical Pharmacy Specialist

## 2021-07-17 ENCOUNTER — Encounter: Payer: Self-pay | Admitting: *Deleted

## 2021-07-17 DIAGNOSIS — E1169 Type 2 diabetes mellitus with other specified complication: Secondary | ICD-10-CM | POA: Insufficient documentation

## 2021-07-17 LAB — LIPID PANEL
Chol/HDL Ratio: 3.1 ratio (ref 0.0–4.4)
Cholesterol, Total: 124 mg/dL (ref 100–199)
HDL: 40 mg/dL (ref >39)
LDL Chol Calc (NIH): 62 mg/dL (ref 0–99)
Triglycerides: 124 mg/dL (ref 0–149)
VLDL Cholesterol Cal: 22 mg/dL (ref 5–40)

## 2021-07-17 LAB — COMPREHENSIVE METABOLIC PANEL
ALT: 20 IU/L (ref 0–32)
AST: 17 IU/L (ref 0–40)
Albumin/Globulin Ratio: 1.7 (ref 1.2–2.2)
Albumin: 4.5 g/dL (ref 3.8–4.8)
Alkaline Phosphatase: 91 IU/L (ref 44–121)
BUN/Creatinine Ratio: 24 (ref 12–28)
BUN: 19 mg/dL (ref 8–27)
Bilirubin Total: 0.3 mg/dL (ref 0.0–1.2)
CO2: 24 mmol/L (ref 20–29)
Calcium: 9.2 mg/dL (ref 8.7–10.3)
Chloride: 99 mmol/L (ref 96–106)
Creatinine, Ser: 0.8 mg/dL (ref 0.57–1.00)
Globulin, Total: 2.7 g/dL (ref 1.5–4.5)
Glucose: 140 mg/dL — ABNORMAL HIGH (ref 65–99)
Potassium: 4.4 mmol/L (ref 3.5–5.2)
Sodium: 140 mmol/L (ref 134–144)
Total Protein: 7.2 g/dL (ref 6.0–8.5)
eGFR: 79 mL/min/{1.73_m2} (ref >59)

## 2021-07-17 LAB — HEMOGLOBIN A1C
Est. average glucose Bld gHb Est-mCnc: 169 mg/dL
Hgb A1c MFr Bld: 7.5 % — ABNORMAL HIGH (ref 4.8–5.6)

## 2021-07-17 NOTE — Assessment & Plan Note (Signed)
Patient with excellent LDL today at 62, having been on rosuvastatin 5 mg daily or every other day for the past few months.  Will have her stop completely however, to determine if this is cause of her myalgias and joint pains.  Advised that if symptoms resolve, she should start back on 5 mg just twice weekly (mondays and fridays), to see if this dose would be more tolerable.  Will continue to monitor at follow up appointments.

## 2021-07-17 NOTE — Assessment & Plan Note (Signed)
Patient with resistant hypertension, currently frustrated with lack of improvement.  She has only used the prn hydrochlorothiazide 3 times since her appointment with Dr. Ellyn Hack 3 months ago.  I have asked that she discontinue this completely.  Will divide up her olmesartan hct tablet and have her instead take olmesartan 40 mg once daily, chlorthalidone 25 mg once daily, and continue with the carvedilol and amlodipine.  Since she cannot tolerate higher than 2.5 mg of amlodipine, we can consider removing this completely in the future if able.  Her kidney function and potassium levels are excellent, there is no reason that we can't try adding spironolactone at her next visit if all is well.  We also have a little room to increase carvedilol as her heart rate runs between 65-75.  I will see her back in 3 weeks for follow up.

## 2021-08-04 ENCOUNTER — Ambulatory Visit (INDEPENDENT_AMBULATORY_CARE_PROVIDER_SITE_OTHER): Payer: Medicare HMO | Admitting: Pharmacist Clinician (PhC)/ Clinical Pharmacy Specialist

## 2021-08-04 ENCOUNTER — Encounter: Payer: Self-pay | Admitting: Pharmacist Clinician (PhC)/ Clinical Pharmacy Specialist

## 2021-08-04 ENCOUNTER — Other Ambulatory Visit: Payer: Self-pay

## 2021-08-04 DIAGNOSIS — I10 Essential (primary) hypertension: Secondary | ICD-10-CM

## 2021-08-04 NOTE — Patient Instructions (Signed)
Return for a a follow up appointment September 26 at 11:30 am  Check your blood pressure at home daily and keep record of the readings.  Call with questions/concerns, Erasmo Downer at [phone]  Take your BP meds as follows:  Increase amlodipine to 5 mg once daily in the evenings.  If you notice any swelling, please stop and give me a call.   Bring all of your meds, your BP cuff and your record of home blood pressures to your next appointment.  Exercise as you're able, try to walk approximately 30 minutes per day.  Keep salt intake to a minimum, especially watch canned and prepared boxed foods.  Eat more fresh fruits and vegetables and fewer canned items.  Avoid eating in fast food restaurants.    HOW TO TAKE YOUR BLOOD PRESSURE: Rest 5 minutes before taking your blood pressure.  Don't smoke or drink caffeinated beverages for at least 30 minutes before. Take your blood pressure before (not after) you eat. Sit comfortably with your back supported and both feet on the floor (don't cross your legs). Elevate your arm to heart level on a table or a desk. Use the proper sized cuff. It should fit smoothly and snugly around your bare upper arm. There should be enough room to slip a fingertip under the cuff. The bottom edge of the cuff should be 1 inch above the crease of the elbow. Ideally, take 3 measurements at one sitting and record the average

## 2021-08-04 NOTE — Progress Notes (Signed)
08/04/2021 Suzanne Patterson 1951-03-13 [id number]   HPI:  Suzanne Patterson is a 70 y.o. female patient of Dr Ellyn Hack, who presents today for hypertension and lipid clinic evaluation. In addition to these, her medical history is significant for DM2 (A1c today at 7.5).  She went to her dentist earlier this year to have some work done, but they noted her blood pressure to be extremely elevated and refused to do any treatments until she was better controlled.  She saw Dr. Ellyn Hack a month or so later, at which time her pressure was still elevated at 173/102.  He increased carvedilol to 6.25 mg and re-started her olmesartan hct 40/12.5.  I saw her about 3 weeks ago at which time her pressure was still elevated at 150/92.  I stopped the hydrochlorothiazide and instead had her take olmesartan 40 mg and chlorthalidone 25 mg, both once daily.  She was also having ongoing muscle aches and joint pain, so had her discontinue rosuvastatin until follow up.    Today she returns, with her husband, for follow up.  She notes that her joints and muscles don't hurt nearly as much as previously, and her home BP readings are finally starting to trend down.  She still notes quite a bit of stress in her family life, and feels that this will always be a problem for her.  She is happy that her home BP readings are improved, and that she is seeing more diastolic readings < 017 now.  Her weight is down another 4 pounds and she feels that the chlorthalidone is much better for her, as the hctz caused her to have some issues with incontinence.     Blood Pressure Goal:  130/80  Current Medications: amlodipine 2.5 mg qd (pm)  carvedilol 6.25 mg bid, olmesartan 40 mg qd (pm), chlorthalidone 25 mg qd  Family Hx:  parents died in auto accisdent when 67 and 65; no issues to that point in life; pgm - chf in her 48's; 7 siblings - 2 brothers with strokes, both still living (1 parapalegic); several with  hypertension  Social Hx: no tobacco, rare O'Doules beer, all tea and coffee decaf  Diet: very cautious about diet - no added salt,  proteins include fish and chicken, rare pork or beef;  vegetables fresh and frozen; chili regularly, likes beans; snacks are mostly fruit; unsalted trail mix (home made)  Exercise: now walking about 3/4 mile at park or on treadmill, goes by calorie loss on treadmill, about 12 minutes, doing this daily (either one), goal is to get on treadmill three times daily  Home BP readings Omron home cuff ~70 years old 34 home readings show average 149/105 (18 am/16 pm readings, all same average);   previously 12 readings averaged 153/110  Intolerances: higher doses amloidpine  Labs: 07/16/21:  Na 140, K 4.4, Glu 140, BUN 19, SCr 0.8 GFR 79   Wt Readings from Last 3 Encounters:  08/04/21 261 lb 9.6 oz (118.7 kg)  07/16/21 265 lb (120.2 kg)  03/11/21 250 lb 12.8 oz (113.8 kg)   BP Readings from Last 3 Encounters:  08/04/21 (!) 158/92  07/16/21 (!) 150/92  03/11/21 (!) 173/102   Pulse Readings from Last 3 Encounters:  08/04/21 67  07/16/21 84  03/11/21 87    Current Outpatient Medications  Medication Sig Dispense Refill   acetaminophen (TYLENOL) 650 MG CR tablet Take 1 tablet by mouth as needed.     albuterol (VENTOLIN HFA) 108 (90  Base) MCG/ACT inhaler Inhale into the lungs every 6 (six) hours as needed for wheezing or shortness of breath.     ALPRAZolam (XANAX) 0.5 MG tablet Take 0.5 mg by mouth at bedtime as needed for anxiety.     amLODipine (NORVASC) 2.5 MG tablet Take 2.5 mg by mouth daily.     aspirin 81 MG chewable tablet Chew 81 mg by mouth daily.     Blood Glucose Calibration (ACCU-CHEK AVIVA) SOLN Apply 1 applicator topically See admin instructions.     Blood Glucose Monitoring Suppl (ACCU-CHEK AVIVA PLUS) w/Device KIT Apply 1 applicator topically See admin instructions.     carvedilol (COREG) 6.25 MG tablet Take 1 tablet (6.25 mg total) by mouth 2  (two) times daily. 180 tablet 3   chlorthalidone (HYGROTON) 25 MG tablet Take 1 tablet (25 mg total) by mouth daily. 30 tablet 3   co-enzyme Q-10 30 MG capsule Take 30 mg by mouth 3 (three) times daily.     escitalopram (LEXAPRO) 5 MG tablet Take 5 mg by mouth daily.  1   ezetimibe (ZETIA) 10 MG tablet Take 10 mg by mouth daily.     famotidine (PEPCID) 10 MG tablet Take 10 mg by mouth 2 (two) times daily.     ibuprofen (ADVIL) 200 MG tablet Take 1 tablet by mouth as needed.     olmesartan (BENICAR) 40 MG tablet Take 1 tablet (40 mg total) by mouth daily. 30 tablet 3   ondansetron (ZOFRAN) 4 MG tablet Take 1 tablet by mouth as needed.     Probiotic Product (PROBIOTIC ADVANCED PO) Take by mouth.     rosuvastatin (CRESTOR) 5 MG tablet Takes on Monday, Wednesday and fridays     FLOVENT DISKUS 50 MCG/BLIST diskus inhaler Inhale 1 puff into the lungs as needed. (Patient not taking: Reported on 08/04/2021)     fluconazole (DIFLUCAN) 150 MG tablet Take 150 mg by mouth once. (Patient not taking: Reported on 08/04/2021)     No current facility-administered medications for this visit.    Allergies  Allergen Reactions   Iodinated Diagnostic Agents Hives     Per patient notation ( written) 07/23/96 received  130 cc hypaque 60% ( ionic contrast)  reaction - hives  Per  instruction when scheduling  X-rays with contrast   options are- 1) w/o IV, 2) non- ionic,3) pre medicated    Latex     Itching hives    Amlodipine Besylate     Other reaction(s): swelling (ok with 2.5)   Amoxicillin     Other reaction(s): hives   Cephalexin     Other reaction(s): yeast infection   Codeine    Hydrocodone    Olmesartan     Other reaction(s): tongue swelling?    Past Medical History:  Diagnosis Date   Anxiety    Depression    DM (diabetes mellitus), type 2 (HCC)    dIET CONTROLLED   Environmental and seasonal allergies    GERD (gastroesophageal reflux disease)    Hyperlipidemia associated with type 2 diabetes  mellitus (HCC)    Hypertension    Uterine polyp     Blood pressure (!) 158/92, pulse 67, resp. rate 14, height $RemoveBe'5\' 5"'tpEmMvBvU$  (1.651 m), weight 261 lb 9.6 oz (118.7 kg), SpO2 96 %.  Essential hypertension Patient with systolic and diastolic hypertension, showing small amount of improvement since switching hctz to chlorthalidone.  Discussed options and she would like to try increasing amlodipine to 5 mg daily.  Chart notes  that it caused edema, but she thinks it may have been unrelated to the medication.  She states edema was in mouth/lips, which is not common for higher doses of amlodipine.  Will have her increase to 5 mg daily, however she understands to stop the medication completely if angioedema occurs.   If she is not able to tolerate the 5 mg dose (for angioedema or LEE), she is to contact the office and we will instead try hydralazine 25 mg bid.  Will see her back in 1 month for follow up.     Tommy Medal PharmD CPP [facility] [address] [place], [place] [address] [phone]

## 2021-08-04 NOTE — Assessment & Plan Note (Signed)
Patient with systolic and diastolic hypertension, showing small amount of improvement since switching hctz to chlorthalidone.  Discussed options and she would like to try increasing amlodipine to 5 mg daily.  Chart notes that it caused edema, but she thinks it may have been unrelated to the medication.  She states edema was in mouth/lips, which is not common for higher doses of amlodipine.  Will have her increase to 5 mg daily, however she understands to stop the medication completely if angioedema occurs.   If she is not able to tolerate the 5 mg dose (for angioedema or LEE), she is to contact the office and we will instead try hydralazine 25 mg bid.  Will see her back in 1 month for follow up.

## 2021-08-13 ENCOUNTER — Encounter: Payer: Self-pay | Admitting: Orthopaedic Surgery

## 2021-08-13 ENCOUNTER — Other Ambulatory Visit: Payer: Self-pay

## 2021-08-13 ENCOUNTER — Ambulatory Visit: Payer: Medicare HMO | Admitting: Orthopaedic Surgery

## 2021-08-13 ENCOUNTER — Telehealth: Payer: Self-pay | Admitting: Orthopaedic Surgery

## 2021-08-13 ENCOUNTER — Ambulatory Visit (INDEPENDENT_AMBULATORY_CARE_PROVIDER_SITE_OTHER): Payer: Medicare HMO

## 2021-08-13 DIAGNOSIS — M25572 Pain in left ankle and joints of left foot: Secondary | ICD-10-CM | POA: Diagnosis not present

## 2021-08-13 NOTE — Progress Notes (Signed)
Office Visit Note   Patient: Suzanne Patterson           Date of Birth: February 23, 1951           MRN: [id number] Visit Date: 08/13/2021              Requested by: [name], [place],  [place] [id number] PCP: [name], MD   Assessment & Plan: Visit Diagnoses:  1. Pain in left ankle and joints of left foot     Plan: ACute onset of left ankle pain this morning when she arose from her bed.  Denies any injury or trauma.  Feels little better this afternoon but still having pain along the Achilles tendon and lateral of the ankle joint.  Films demonstrate some arthritis in the tibiotalar joint which could account for her pain but does have some tenderness over the anterior talofibular ligament and the Achilles tendon which could be inflammatory.  Achilles tendon is intact.  She is able to move her ankle without much trouble this afternoon.  She is diabetic and has some neuropathy.  We will try an equalizer boot and see how she does over the next several days.  She can try Voltaren gel as well  Follow-Up Instructions: Return if symptoms worsen or fail to improve.   Orders:  Orders Placed This Encounter  Procedures   XR Ankle Complete Left   No orders of the defined types were placed in this encounter.     Procedures: No procedures performed   Clinical Data: No additional findings.   Subjective: Chief Complaint  Patient presents with   Left Ankle - Pain  Patient presents today for left ankle pain. She states that she woke this morning to go to the restroom and had intense pain in her ankle. No injury. She said that she was unable to bear weight. The pain is all throughout her ankle. No swelling.   HPI  Review of Systems   Objective: Vital Signs: There were no vitals taken for this visit.  Physical Exam Constitutional:      Appearance: She is well-developed.  Pulmonary:     Effort: Pulmonary effort is normal.  Skin:    General:  Skin is warm and dry.  Neurological:     Mental Status: She is alert and oriented to person, place, and time.  Psychiatric:        Behavior: Behavior normal.    Ortho Exam awake alert and oriented x3.  Comfortable sitting.  Left ankle with some tenderness and mild swelling over the anterior talofibular ligament.  No instability.  No pain along the posterior tibial tendon or peroneal tendons but does have some tenderness along the proximal Achilles tendon.  The tendon is intact.  There is no redness or skin changes.  Has had some numbness and tingling and burning in her feet consistent with her neuropathy.  No forefoot or midfoot pain.  Does not appear to have any significant loss of dorsiflexion of the ankle  Specialty Comments:  No specialty comments available.  Imaging: XR Ankle Complete Left  Result Date: 08/13/2021 Films of the left ankle obtained in several projections.  There appears to be some arthritis in the ankle joint with sclerosis in the mid tibiotalar articulation.  Some areas of ectopic calcification laterally and medially.  No acute changes.  Films are consistent with with osteoarthritis    PMFS History: Patient Active Problem List   Diagnosis Date Noted  Pain in left ankle and joints of left foot 08/13/2021   Hyperlipidemia associated with type 2 diabetes mellitus ([facility]) 07/17/2021   Accelerated hypertension 03/13/2021   Pain in left shoulder 01/14/2021   Cervicalgia 01/14/2021   Essential hypertension 07/22/2020   Atypical chest pain 07/17/2020   Precordial pain 07/17/2020   Effusion, left knee 07/03/2020   Primary osteoarthritis of left knee 07/03/2020   Pain in right leg 07/31/2019   Past Medical History:  Diagnosis Date   Anxiety    Depression    DM (diabetes mellitus), type 2 ([facility])    dIET CONTROLLED   Environmental and seasonal allergies    GERD (gastroesophageal reflux disease)    Hyperlipidemia associated with type 2 diabetes mellitus ([facility])     Hypertension    Uterine polyp     Family History  Family history unknown: Yes    Past Surgical History:  Procedure Laterality Date   DILATATION & CURETTAGE/HYSTEROSCOPY WITH MYOSURE N/A 06/04/2019   Procedure: DILATATION & CURETTAGE/HYSTEROSCOPY WITH MYOSURE;  Surgeon: Janyth Pupa, DO;  Location: [facility];  Service: Gynecology;  Laterality: N/A;  [place] Myosure Rep will be here.  Confirmed on 05/29/19 CS   EXCISION OF SKIN TAG Right 06/04/2019   Procedure: EXCISION OF SKIN TAG;  Surgeon: Janyth Pupa, DO;  Location: [facility];  Service: Gynecology;  Laterality: Right;   FOOT SURGERY Right    NM MYOVIEW LTD  08/20/2020   Lexiscan Myoview:  EF 60-70%.  T wave inversion in inferior lateral leads Lexiscan.  Small sized mild severity defect in the mid anterior and anterolateral location that improved with stress.  Not consistent with ischemia.  Felt to be   Related to breast attenuation artifact.   Social History   Occupational History   Not on file  Tobacco Use   Smoking status: Never   Smokeless tobacco: Never  Vaping Use   Vaping Use: Never used  Substance and Sexual Activity   Alcohol use: No   Drug use: No   Sexual activity: Not on file

## 2021-08-13 NOTE — Telephone Encounter (Signed)
Called and spoke with patient. Scheduled for this afternoon.

## 2021-08-13 NOTE — Telephone Encounter (Signed)
Pt called is in a lot of pain. She states she been up since 5:30 and thinks she may have broken her ankle. She would like to know if she could be seen today?   CB [phone]

## 2021-08-18 ENCOUNTER — Other Ambulatory Visit: Payer: Self-pay | Admitting: Pharmacist Clinician (PhC)/ Clinical Pharmacy Specialist

## 2021-08-18 MED ORDER — AMLODIPINE BESYLATE 5 MG PO TABS: 5.0000 mg | ORAL_TABLET | Freq: Every day | ORAL | 3 refills | Status: DC

## 2021-08-24 DIAGNOSIS — Z20822 Contact with and (suspected) exposure to covid-19: Secondary | ICD-10-CM | POA: Diagnosis not present

## 2021-08-24 DIAGNOSIS — J069 Acute upper respiratory infection, unspecified: Secondary | ICD-10-CM | POA: Diagnosis not present

## 2021-08-24 DIAGNOSIS — U071 COVID-19: Secondary | ICD-10-CM | POA: Diagnosis not present

## 2021-08-24 DIAGNOSIS — R519 Headache, unspecified: Secondary | ICD-10-CM | POA: Diagnosis not present

## 2021-08-24 DIAGNOSIS — R051 Acute cough: Secondary | ICD-10-CM | POA: Diagnosis not present

## 2021-08-26 ENCOUNTER — Telehealth: Payer: Self-pay | Admitting: Cardiology

## 2021-08-26 NOTE — Telephone Encounter (Signed)
New Message:     Patient called and said she have Covid. She said her primary doctor wanted her cut her Amlodipine in half  and  stopped her Rosuvastatin,, because he said it might be an interaction with the antibiotic for Covid. She was told to notify Dr Ellyn Hack of this change.

## 2021-08-27 ENCOUNTER — Ambulatory Visit: Payer: Medicare HMO | Admitting: Orthopaedic Surgery

## 2021-08-27 NOTE — Telephone Encounter (Signed)
Spoke to patient - she is aware. Of instruction from  pharmacist [place].  Patient and husband both have Covid

## 2021-08-27 NOTE — Telephone Encounter (Signed)
Forward to [facility] Pharmacist for recommendation

## 2021-08-27 NOTE — Telephone Encounter (Signed)
Sounds like she's being prescribed Paxlovid. That's fine to cut her amlodipine in half and stop rosuvastatin for the 5 day period that she's on Paxlovid due to drug interactions. She also should avoid using her Xanax while on Paxlovid. She should resume normal med dosing after her course of Paxlovid is complete.

## 2021-08-31 ENCOUNTER — Ambulatory Visit: Payer: Medicare HMO

## 2021-09-03 ENCOUNTER — Ambulatory Visit: Payer: Medicare HMO

## 2021-09-22 NOTE — Telephone Encounter (Signed)
Called and scheduled for nl 11/2

## 2021-09-22 NOTE — Telephone Encounter (Signed)
Will forward to our PharmD team to further advise and follow-up with the pt on long-standing conversations of statin hold.

## 2021-09-22 NOTE — Telephone Encounter (Signed)
Pt c/o medication issue:  1. Name of Medication:  rosuvastatin (CRESTOR) 5 MG tablet  2. How are you currently taking this medication (dosage and times per day)?  Patient states she has not been taking this medication   3. Are you having a reaction (difficulty breathing--STAT)?   4. What is your medication issue?   Patient states she has not been back on this medication since she was advised to stop taking it and she is no longer having any joint pain. She would like to speak with a pharmacist to determine whether or not she needs to be put on a different cholesterol medication.

## 2021-09-22 NOTE — Telephone Encounter (Signed)
Please call to schedule appt in lipid clinic at [facility]

## 2021-10-07 ENCOUNTER — Other Ambulatory Visit: Payer: Self-pay

## 2021-10-07 ENCOUNTER — Ambulatory Visit: Payer: Medicare HMO | Admitting: Pharmacist

## 2021-10-07 VITALS — BP 154/92 | HR 69 | Resp 16 | Ht 65.0 in

## 2021-10-07 DIAGNOSIS — E1169 Type 2 diabetes mellitus with other specified complication: Secondary | ICD-10-CM | POA: Diagnosis not present

## 2021-10-07 DIAGNOSIS — R0789 Other chest pain: Secondary | ICD-10-CM

## 2021-10-07 DIAGNOSIS — Z23 Encounter for immunization: Secondary | ICD-10-CM | POA: Diagnosis not present

## 2021-10-07 DIAGNOSIS — E785 Hyperlipidemia, unspecified: Secondary | ICD-10-CM | POA: Diagnosis not present

## 2021-10-07 NOTE — Patient Instructions (Signed)
It was nice meeting you today!  We would like your LDL (bad cholesterol) to be less than 55  We will start you on a new medication called Praluent which you will inject once every 2 weeks  We will complete the prior authorization for you and contact you when it is approved  Once you start the new medication you can discontinue the Zetia  We will recheck your cholesterol panel in 2-3 months  Please call with any questions!  Karren Cobble, PharmD, BCACP, [place], [place], [place], [place], [address] Phone: [phone], Fax: [phone]

## 2021-10-07 NOTE — Progress Notes (Signed)
Patient ID: Suzanne Patterson                 DOB: [birth year]                    MRN: [id number]     HPI: Suzanne Patterson is a 70 y.o. female patient referred to lipid clinic by Dr Ellyn Hack. PMH is significant for T2DM, HTN, and HLD.  Patient is intolerant to rosuvastatin. Currently only managed on Zetia.  Patient presents today with husband in good spirits. Has a strong family history of CAD. Reports parents died of MI when she was only 47.    Recently recovered from [place] and finished a course of Paxlovid.  Discontinued rosuvastatin 4-6 weeks ago due to severe muscle pain.  Current Medications: Zetia 37m  Intolerances: rosuvastatin Risk Factors: family history, T2DM LDL goal: <55  Labs: TC 124, HDL 40, LDL 62, Trigs 124 (on rosuvastatin 544m- 07/16/21)  Past Medical History:  Diagnosis Date   Anxiety    Depression    DM (diabetes mellitus), type 2 ([facility])    dIET CONTROLLED   Environmental and seasonal allergies    GERD (gastroesophageal reflux disease)    Hyperlipidemia associated with type 2 diabetes mellitus ([facility]   Hypertension    Uterine polyp     Current Outpatient Medications on File Prior to Visit  Medication Sig Dispense Refill   acetaminophen (TYLENOL) 650 MG CR tablet Take 1 tablet by mouth as needed.     albuterol (VENTOLIN HFA) 108 (90 Base) MCG/ACT inhaler Inhale into the lungs every 6 (six) hours as needed for wheezing or shortness of breath.     ALPRAZolam (XANAX) 0.5 MG tablet Take 0.5 mg by mouth at bedtime as needed for anxiety.     amLODipine (NORVASC) 5 MG tablet Take 1 tablet (5 mg total) by mouth daily. 90 tablet 3   aspirin 81 MG chewable tablet Chew 81 mg by mouth daily.     Blood Glucose Calibration (ACCU-CHEK AVIVA) SOLN Apply 1 applicator topically See admin instructions.     Blood Glucose Monitoring Suppl (ACCU-CHEK AVIVA PLUS) w/Device KIT Apply 1 applicator topically See admin instructions.     carvedilol (COREG) 6.25 MG tablet Take  1 tablet (6.25 mg total) by mouth 2 (two) times daily. 180 tablet 3   chlorthalidone (HYGROTON) 25 MG tablet Take 1 tablet (25 mg total) by mouth daily. 30 tablet 3   co-enzyme Q-10 30 MG capsule Take 30 mg by mouth 3 (three) times daily.     escitalopram (LEXAPRO) 5 MG tablet Take 5 mg by mouth daily.  1   ezetimibe (ZETIA) 10 MG tablet Take 10 mg by mouth daily.     famotidine (PEPCID) 10 MG tablet Take 10 mg by mouth 2 (two) times daily.     fluconazole (DIFLUCAN) 150 MG tablet Take 150 mg by mouth once.     ibuprofen (ADVIL) 200 MG tablet Take 1 tablet by mouth as needed.     olmesartan (BENICAR) 40 MG tablet Take 1 tablet (40 mg total) by mouth daily. 30 tablet 3   ondansetron (ZOFRAN) 4 MG tablet Take 1 tablet by mouth as needed.     Probiotic Product (PROBIOTIC ADVANCED PO) Take by mouth.     FLOVENT DISKUS 50 MCG/BLIST diskus inhaler Inhale 1 puff into the lungs as needed. (Patient not taking: Reported on 10/07/2021)     rosuvastatin (CRESTOR) 5 MG tablet Takes on Monday, Wednesday  and fridays (Patient not taking: Reported on 10/07/2021)     No current facility-administered medications on file prior to visit.    Allergies  Allergen Reactions   Iodinated Diagnostic Agents Hives     Per patient notation ( written) 07/23/96 received  130 cc hypaque 60% ( ionic contrast)  reaction - hives  Per  instruction when scheduling  X-rays with contrast   options are- 1) w/o IV, 2) non- ionic,3) pre medicated    Latex     Itching hives    Amlodipine Besylate     Other reaction(s): swelling (ok with 2.5)   Amoxicillin     Other reaction(s): hives   Cephalexin     Other reaction(s): yeast infection   Codeine    Crestor [Rosuvastatin]     myalgias   Hydrocodone    Olmesartan     Other reaction(s): tongue swelling?    Assessment/Plan:  1. Hyperlipidemia - Patient most recent LDL 62 however this was when she was taking rosuvastatin.  Likely has increased since now. This is above goal of  <55. Aggressive goal chosen due to T2DM and family history.    Recommend patient start PCSK9i.  Using [employer], educated patient on mechanism of action, storage, site selection, administration, and possible adverse effects.  Patient was able to demonstrate in room.  Will complete prior authorization and contact patient when approved.  Advised she can discontinue Zetia once she starts Praluent.  Repeat lipid panel in 2-3 months.  Karren Cobble, PharmD, BCACP, [place], [place], [place], [place], [address] Phone: [phone], Fax: [phone]

## 2021-10-09 ENCOUNTER — Telehealth: Payer: Self-pay | Admitting: Pharmacist

## 2021-10-09 DIAGNOSIS — E1169 Type 2 diabetes mellitus with other specified complication: Secondary | ICD-10-CM

## 2021-10-09 DIAGNOSIS — E785 Hyperlipidemia, unspecified: Secondary | ICD-10-CM

## 2021-10-09 NOTE — Telephone Encounter (Signed)
PA submitted for Praluent 75mg .  Key: [id number]

## 2021-10-09 NOTE — Telephone Encounter (Signed)
PA approved through 12/05/21. 

## 2021-10-09 NOTE — Telephone Encounter (Signed)
PA for Praluent approved.  Called patient and she does not think she will be able to afford copay.  Plans on filling out patient assistance paperwork and bringing back to office.

## 2021-10-12 NOTE — Telephone Encounter (Signed)
Yes patient has the forms

## 2021-10-15 DIAGNOSIS — Z0000 Encounter for general adult medical examination without abnormal findings: Secondary | ICD-10-CM | POA: Diagnosis not present

## 2021-10-15 DIAGNOSIS — Z1159 Encounter for screening for other viral diseases: Secondary | ICD-10-CM | POA: Diagnosis not present

## 2021-10-15 DIAGNOSIS — E7800 Pure hypercholesterolemia, unspecified: Secondary | ICD-10-CM | POA: Diagnosis not present

## 2021-10-15 DIAGNOSIS — Z1211 Encounter for screening for malignant neoplasm of colon: Secondary | ICD-10-CM | POA: Diagnosis not present

## 2021-10-15 DIAGNOSIS — N764 Abscess of vulva: Secondary | ICD-10-CM | POA: Diagnosis not present

## 2021-10-15 DIAGNOSIS — N907 Vulvar cyst: Secondary | ICD-10-CM | POA: Diagnosis not present

## 2021-10-15 DIAGNOSIS — E1169 Type 2 diabetes mellitus with other specified complication: Secondary | ICD-10-CM | POA: Diagnosis not present

## 2021-10-15 DIAGNOSIS — I10 Essential (primary) hypertension: Secondary | ICD-10-CM | POA: Diagnosis not present

## 2021-10-15 DIAGNOSIS — F419 Anxiety disorder, unspecified: Secondary | ICD-10-CM | POA: Diagnosis not present

## 2021-10-15 DIAGNOSIS — K219 Gastro-esophageal reflux disease without esophagitis: Secondary | ICD-10-CM | POA: Diagnosis not present

## 2021-10-15 DIAGNOSIS — R69 Illness, unspecified: Secondary | ICD-10-CM | POA: Diagnosis not present

## 2021-10-15 DIAGNOSIS — Z1389 Encounter for screening for other disorder: Secondary | ICD-10-CM | POA: Diagnosis not present

## 2021-10-21 DIAGNOSIS — Z0100 Encounter for examination of eyes and vision without abnormal findings: Secondary | ICD-10-CM | POA: Diagnosis not present

## 2021-10-21 DIAGNOSIS — E7800 Pure hypercholesterolemia, unspecified: Secondary | ICD-10-CM | POA: Diagnosis not present

## 2021-10-21 DIAGNOSIS — H5200 Hypermetropia, unspecified eye: Secondary | ICD-10-CM | POA: Diagnosis not present

## 2021-10-21 DIAGNOSIS — E119 Type 2 diabetes mellitus without complications: Secondary | ICD-10-CM | POA: Diagnosis not present

## 2021-11-11 ENCOUNTER — Other Ambulatory Visit: Payer: Self-pay | Admitting: Cardiology

## 2021-11-17 DIAGNOSIS — J45909 Unspecified asthma, uncomplicated: Secondary | ICD-10-CM | POA: Diagnosis not present

## 2021-11-17 DIAGNOSIS — Z91012 Allergy to eggs: Secondary | ICD-10-CM | POA: Diagnosis not present

## 2021-11-17 DIAGNOSIS — Z880 Allergy status to penicillin: Secondary | ICD-10-CM | POA: Diagnosis not present

## 2021-11-17 DIAGNOSIS — Z91040 Latex allergy status: Secondary | ICD-10-CM | POA: Diagnosis not present

## 2021-11-17 DIAGNOSIS — T753XXD Motion sickness, subsequent encounter: Secondary | ICD-10-CM | POA: Diagnosis not present

## 2021-11-17 DIAGNOSIS — I10 Essential (primary) hypertension: Secondary | ICD-10-CM | POA: Diagnosis not present

## 2021-11-17 DIAGNOSIS — Z7951 Long term (current) use of inhaled steroids: Secondary | ICD-10-CM | POA: Diagnosis not present

## 2021-11-17 DIAGNOSIS — R69 Illness, unspecified: Secondary | ICD-10-CM | POA: Diagnosis not present

## 2021-11-19 DIAGNOSIS — J45909 Unspecified asthma, uncomplicated: Secondary | ICD-10-CM | POA: Diagnosis not present

## 2021-11-19 DIAGNOSIS — E1169 Type 2 diabetes mellitus with other specified complication: Secondary | ICD-10-CM | POA: Diagnosis not present

## 2021-11-19 DIAGNOSIS — I10 Essential (primary) hypertension: Secondary | ICD-10-CM | POA: Diagnosis not present

## 2021-11-19 DIAGNOSIS — K219 Gastro-esophageal reflux disease without esophagitis: Secondary | ICD-10-CM | POA: Diagnosis not present

## 2021-11-19 DIAGNOSIS — J4520 Mild intermittent asthma, uncomplicated: Secondary | ICD-10-CM | POA: Diagnosis not present

## 2021-11-19 DIAGNOSIS — E7800 Pure hypercholesterolemia, unspecified: Secondary | ICD-10-CM | POA: Diagnosis not present

## 2021-11-19 DIAGNOSIS — E119 Type 2 diabetes mellitus without complications: Secondary | ICD-10-CM | POA: Diagnosis not present

## 2021-12-16 ENCOUNTER — Other Ambulatory Visit: Payer: Self-pay | Admitting: Family Medicine

## 2021-12-16 ENCOUNTER — Ambulatory Visit
Admission: RE | Admit: 2021-12-16 | Discharge: 2021-12-16 | Disposition: A | Discharge status: Home / Self Care | Payer: Medicare HMO | Source: Ambulatory Visit | Attending: Family Medicine | Admitting: Family Medicine

## 2021-12-16 DIAGNOSIS — Z1231 Encounter for screening mammogram for malignant neoplasm of breast: Secondary | ICD-10-CM | POA: Diagnosis not present

## 2021-12-16 DIAGNOSIS — Z139 Encounter for screening, unspecified: Secondary | ICD-10-CM

## 2022-01-29 DIAGNOSIS — J302 Other seasonal allergic rhinitis: Secondary | ICD-10-CM | POA: Diagnosis not present

## 2022-01-29 DIAGNOSIS — J45901 Unspecified asthma with (acute) exacerbation: Secondary | ICD-10-CM | POA: Diagnosis not present

## 2022-02-04 DIAGNOSIS — J45901 Unspecified asthma with (acute) exacerbation: Secondary | ICD-10-CM | POA: Diagnosis not present

## 2022-02-24 ENCOUNTER — Telehealth: Payer: Self-pay | Admitting: Cardiology

## 2022-02-24 MED ORDER — EZETIMIBE 10 MG PO TABS: 10.0000 mg | ORAL_TABLET | Freq: Every day | ORAL | 1 refills | Status: DC

## 2022-02-24 NOTE — Telephone Encounter (Signed)
Refills has been sent to the pharmacy. 

## 2022-02-24 NOTE — Telephone Encounter (Signed)
? ?*  STAT* If patient is at the pharmacy, call can be transferred to refill team. ? ? ?1. Which medications need to be refilled? (please list name of each medication and dose if known) ezetimibe (ZETIA) 10 MG tablet ? ?2. Which pharmacy/location (including street and city if local pharmacy) is medication to be sent to? [place], [place] - [address] ? ?3. Do they need a 30 day or 90 day supply? 90 days ? ? ?Pt is out of meds needs refill today ?

## 2022-03-03 ENCOUNTER — Encounter: Payer: Self-pay | Admitting: Orthopaedic Surgery

## 2022-03-03 ENCOUNTER — Ambulatory Visit (INDEPENDENT_AMBULATORY_CARE_PROVIDER_SITE_OTHER): Payer: Medicare HMO

## 2022-03-03 ENCOUNTER — Other Ambulatory Visit: Payer: Self-pay

## 2022-03-03 ENCOUNTER — Ambulatory Visit: Payer: Medicare HMO | Admitting: Orthopaedic Surgery

## 2022-03-03 DIAGNOSIS — M25522 Pain in left elbow: Secondary | ICD-10-CM

## 2022-03-03 NOTE — Progress Notes (Signed)
? ?Office Visit Note ?  ?Patient: Suzanne Patterson           ?Date of Birth: [birth year]           ?MRN: [id number] ?Visit Date: 03/03/2022 ?             ?Requested by: [name], MD ?[address] ?[address] ?[place],  [place] [address] ?PCP: [name], MD ? ? ?Assessment & Plan: ?Visit Diagnoses:  ?1. Pain in left elbow   ? ? ?Plan: Pleasant 71 year old woman with 1 week history of fall onto her left elbow.  Initially she had a lot of numbing and tingling in her hand and a significant hematoma.  This has improved over the week.  She still has pain in the elbow posteriorly when she reaches overhead pressure hair.  She does however have intact biceps and triceps strength and today her sensation is intact she has good strength distally with resisted finger abduction resisted flexion and extension she is able to oppose her thumb to all of her fingers easily.  We will give her a sling for comfort.  Reevaluate in 2 weeks.  If she had continuing problems we could consider an MRI but the fact that she has gotten better is certainly a good sign ? ?Follow-Up Instructions: No follow-ups on file.  ? ?Orders:  ?Orders Placed This Encounter  ?Procedures  ? XR Elbow Complete Left (3+View)  ? ?No orders of the defined types were placed in this encounter. ? ? ? ? Procedures: ?No procedures performed ? ? ?Clinical Data: ?No additional findings. ? ? ?Subjective: ?Chief Complaint  ?Patient presents with  ? Left Elbow - Pain, Injury  ?Patient presents today for left elbow pain. She fell on 02/21/2022 and landed on her left arm. She has been having pain at the posterior aspect of her elbow since. She is taking tylenol and ibuprofen as needed. She is right hand dominant.  ? ? ? ?Review of Systems  ?All other systems reviewed and are negative. ? ? ?Objective: ?Vital Signs: There were no vitals taken for this visit. ? ?Physical Exam ?Vitals reviewed.  ?Pulmonary:  ?   Effort: Pulmonary effort is normal.  ?Neurological:  ?    General: No focal deficit present.  ? ? ?Ortho Exam ?Examination of her left elbow she has resolving ecchymosis on the back of her elbow that radiates down onto her forearm.  This is resolving.  She has good strength with resisted flexion and extension of her arm.  Biceps and triceps are intact.  She is tender over the posterior arm.  Especially with overhead activities.  No significant shoulder tenderness.  She is able to oppose her thumb to all of her fingers.  She has good strength with resisted abduction of all of her fingers.  Sensation is intact pulses are intact no real tenderness over the radial head or medial or lateral epicondyles ?Specialty Comments:  ?No specialty comments available. ? ?Imaging: ?No results found. ? ? ?PMFS History: ?Patient Active Problem List  ? Diagnosis Date Noted  ? Type 2 diabetes mellitus with other specified complication ([facility]) 19/14/7829  ? Pain in left ankle and joints of left foot 08/13/2021  ? Hyperlipidemia associated with type 2 diabetes mellitus ([facility]) 07/17/2021  ? Accelerated hypertension 03/13/2021  ? Pain in left shoulder 01/14/2021  ? Cervicalgia 01/14/2021  ? Essential hypertension 07/22/2020  ? Atypical chest pain 07/17/2020  ? Precordial pain 07/17/2020  ? Effusion, left knee 07/03/2020  ?  Primary osteoarthritis of left knee 07/03/2020  ? Pain in right leg 07/31/2019  ? ?Past Medical History:  ?Diagnosis Date  ? Anxiety   ? Depression   ? DM (diabetes mellitus), type 2 ([facility])   ? dIET CONTROLLED  ? Environmental and seasonal allergies   ? GERD (gastroesophageal reflux disease)   ? Hyperlipidemia associated with type 2 diabetes mellitus ([facility])   ? Hypertension   ? Uterine polyp   ?  ?Family History  ?Problem Relation Age of Onset  ? Breast cancer Neg Hx   ?  ?Past Surgical History:  ?Procedure Laterality Date  ? DILATATION & CURETTAGE/HYSTEROSCOPY WITH MYOSURE N/A 06/04/2019  ? Procedure: DILATATION & CURETTAGE/HYSTEROSCOPY WITH MYOSURE;  Surgeon: Janyth Pupa, DO;   Location: [facility];  Service: Gynecology;  Laterality: N/A;  [place] Myosure Rep will be here.  Confirmed on 05/29/19 CS  ? EXCISION OF SKIN TAG Right 06/04/2019  ? Procedure: EXCISION OF SKIN TAG;  Surgeon: Janyth Pupa, DO;  Location: [facility];  Service: Gynecology;  Laterality: Right;  ? FOOT SURGERY Right   ? NM MYOVIEW LTD  08/20/2020  ? Lexiscan Myoview:  EF 60-70%.  T wave inversion in inferior lateral leads Lexiscan.  Small sized mild severity defect in the mid anterior and anterolateral location that improved with stress.  Not consistent with ischemia.  Felt to be   Related to breast attenuation artifact.  ? ?Social History  ? ?Occupational History  ? Not on file  ?Tobacco Use  ? Smoking status: Never  ? Smokeless tobacco: Never  ?Vaping Use  ? Vaping Use: Never used  ?Substance and Sexual Activity  ? Alcohol use: No  ? Drug use: No  ? Sexual activity: Not on file  ? ? ? ? ? ? ?

## 2022-03-17 ENCOUNTER — Ambulatory Visit: Payer: Medicare HMO | Admitting: Orthopaedic Surgery

## 2022-04-02 ENCOUNTER — Telehealth: Payer: Self-pay | Admitting: Cardiology

## 2022-04-02 MED ORDER — OLMESARTAN MEDOXOMIL 40 MG PO TABS: 40.0000 mg | ORAL_TABLET | Freq: Every day | ORAL | 1 refills | Status: DC

## 2022-04-02 MED ORDER — CHLORTHALIDONE 25 MG PO TABS: 25.0000 mg | ORAL_TABLET | Freq: Every day | ORAL | 1 refills | Status: DC

## 2022-04-02 NOTE — Telephone Encounter (Signed)
Refills has been sent to the pharmacy. 

## 2022-04-02 NOTE — Telephone Encounter (Signed)
?*  STAT* If patient is at the pharmacy, call can be transferred to refill team. ? ? ?1. Which medications need to be refilled? (please list name of each medication and dose if known)  ?chlorthalidone (HYGROTON) 25 MG tablet ?olmesartan (BENICAR) 40 MG tablet ? ?2. Which pharmacy/location (including street and city if local pharmacy) is medication to be sent to? [place], [place] - [address] ? ?3. Do they need a 30 day or 90 day supply? 90 day ? ?  ?

## 2022-04-20 DIAGNOSIS — I10 Essential (primary) hypertension: Secondary | ICD-10-CM | POA: Diagnosis not present

## 2022-04-20 DIAGNOSIS — J45909 Unspecified asthma, uncomplicated: Secondary | ICD-10-CM | POA: Diagnosis not present

## 2022-04-20 DIAGNOSIS — R69 Illness, unspecified: Secondary | ICD-10-CM | POA: Diagnosis not present

## 2022-04-20 DIAGNOSIS — E1169 Type 2 diabetes mellitus with other specified complication: Secondary | ICD-10-CM | POA: Diagnosis not present

## 2022-04-20 DIAGNOSIS — E7800 Pure hypercholesterolemia, unspecified: Secondary | ICD-10-CM | POA: Diagnosis not present

## 2022-05-24 DIAGNOSIS — J209 Acute bronchitis, unspecified: Secondary | ICD-10-CM | POA: Diagnosis not present

## 2022-05-24 DIAGNOSIS — R69 Illness, unspecified: Secondary | ICD-10-CM | POA: Diagnosis not present

## 2022-06-18 DIAGNOSIS — I10 Essential (primary) hypertension: Secondary | ICD-10-CM | POA: Diagnosis not present

## 2022-06-18 DIAGNOSIS — Z7982 Long term (current) use of aspirin: Secondary | ICD-10-CM | POA: Diagnosis not present

## 2022-06-18 DIAGNOSIS — J45909 Unspecified asthma, uncomplicated: Secondary | ICD-10-CM | POA: Diagnosis not present

## 2022-06-18 DIAGNOSIS — Z7951 Long term (current) use of inhaled steroids: Secondary | ICD-10-CM | POA: Diagnosis not present

## 2022-06-18 DIAGNOSIS — E785 Hyperlipidemia, unspecified: Secondary | ICD-10-CM | POA: Diagnosis not present

## 2022-06-18 DIAGNOSIS — Z008 Encounter for other general examination: Secondary | ICD-10-CM | POA: Diagnosis not present

## 2022-06-18 DIAGNOSIS — E11630 Type 2 diabetes mellitus with periodontal disease: Secondary | ICD-10-CM | POA: Diagnosis not present

## 2022-06-18 DIAGNOSIS — F419 Anxiety disorder, unspecified: Secondary | ICD-10-CM | POA: Diagnosis not present

## 2022-06-18 DIAGNOSIS — Z8041 Family history of malignant neoplasm of ovary: Secondary | ICD-10-CM | POA: Diagnosis not present

## 2022-06-18 DIAGNOSIS — Z6841 Body Mass Index (BMI) 40.0 and over, adult: Secondary | ICD-10-CM | POA: Diagnosis not present

## 2022-06-18 DIAGNOSIS — K219 Gastro-esophageal reflux disease without esophagitis: Secondary | ICD-10-CM | POA: Diagnosis not present

## 2022-06-18 DIAGNOSIS — R69 Illness, unspecified: Secondary | ICD-10-CM | POA: Diagnosis not present

## 2022-07-01 ENCOUNTER — Other Ambulatory Visit: Payer: Self-pay | Admitting: Cardiology

## 2022-07-01 ENCOUNTER — Telehealth: Payer: Self-pay | Admitting: Cardiology

## 2022-07-01 NOTE — Telephone Encounter (Signed)
*  STAT* If patient is at the pharmacy, call can be transferred to refill team.   1. Which medications need to be refilled? (please list name of each medication and dose if known)   carvedilol (COREG) 6.25 MG tablet  2. Which pharmacy/location (including street and city if local pharmacy) is medication to be sent to?  [place], [place] - [address]  3. Do they need a 30 day or 90 day supply?    90 day  Patient stated she is out of this medication.

## 2022-07-16 DIAGNOSIS — R109 Unspecified abdominal pain: Secondary | ICD-10-CM | POA: Diagnosis not present

## 2022-07-25 ENCOUNTER — Other Ambulatory Visit: Payer: Self-pay | Admitting: Cardiology

## 2022-08-10 ENCOUNTER — Other Ambulatory Visit: Payer: Self-pay | Admitting: Cardiology

## 2022-08-10 ENCOUNTER — Telehealth: Payer: Self-pay | Admitting: Cardiology

## 2022-08-10 NOTE — Telephone Encounter (Signed)
Pt c/o medication issue:  1. Name of Medication:   rosuvastatin (CRESTOR) 5 MG tablet  2. How are you currently taking this medication (dosage and times per day)?   As was previously prescribed  3. Are you having a reaction (difficulty breathing--STAT)? No  4. What is your medication issue?   Patient called stating she has re-started taking this medication on Monday, Wednesday and Friday.  Patient stated her blood pressure has been going down, her blood work has been good and she has been losing weight.

## 2022-08-10 NOTE — Telephone Encounter (Signed)
Called patient, she just wanted [place] to know that she started back on the Rosuvastatin (Monday, Wednesday and Friday) and has been doing well.   I did update medication list.   Thank you!

## 2022-08-10 NOTE — Telephone Encounter (Signed)
Patient came earlier today into the office , appointment schedule for 08/11/22

## 2022-08-11 ENCOUNTER — Ambulatory Visit: Payer: Medicare HMO | Admitting: Cardiology

## 2022-09-03 ENCOUNTER — Other Ambulatory Visit: Payer: Self-pay | Admitting: Cardiology

## 2022-09-13 DIAGNOSIS — Z23 Encounter for immunization: Secondary | ICD-10-CM | POA: Diagnosis not present

## 2022-09-13 DIAGNOSIS — H6123 Impacted cerumen, bilateral: Secondary | ICD-10-CM | POA: Diagnosis not present

## 2022-09-13 DIAGNOSIS — I10 Essential (primary) hypertension: Secondary | ICD-10-CM | POA: Diagnosis not present

## 2022-10-12 ENCOUNTER — Other Ambulatory Visit: Payer: Self-pay | Admitting: Cardiology

## 2022-11-16 ENCOUNTER — Ambulatory Visit: Payer: Medicare Other | Admitting: Cardiology

## 2022-11-17 DIAGNOSIS — U071 COVID-19: Secondary | ICD-10-CM | POA: Diagnosis not present

## 2022-11-25 ENCOUNTER — Other Ambulatory Visit: Payer: Self-pay | Admitting: Cardiology

## 2022-12-09 ENCOUNTER — Other Ambulatory Visit: Payer: Self-pay | Admitting: Family Medicine

## 2022-12-09 DIAGNOSIS — Z1231 Encounter for screening mammogram for malignant neoplasm of breast: Secondary | ICD-10-CM

## 2022-12-19 ENCOUNTER — Other Ambulatory Visit: Payer: Self-pay | Admitting: Cardiology

## 2022-12-26 NOTE — Progress Notes (Deleted)
Cardiology Office Note:    Date:  12/26/2022   ID:  Suzanne Patterson, DOB 05-Mar-1951, MRN [id number]  PCP:  [name], [place] [facility] Cardiologist:  Glenetta Hew, MD { Click to update primary MD,subspecialty MD or APP then REFRESH:1}    Referring MD: [name], MD   No chief complaint on file.   History of Present Illness:    Suzanne Patterson is a 72 y.o. female with a hx of HTN, HLD, DM2, atypical chest pain.  She initially established care with our office in 2021 after several episodes of chest pain. Plan was to evaluate for ischemia with coronary CTA and FFR, however pt is allergic to contrast media. She was sent for a Lexiscan, which was an overall low risk study.   Last seen by Dr. Ellyn Hack on 03/11/21 after an episode at the dentist where her BP was elevated. At this visit, her BP medications were titrated and she followed up with the pharmacy team for up titration of her medications for management of her HTN and her HLD.    **check lipids?   Past Medical History:  Diagnosis Date   Anxiety    Depression    DM (diabetes mellitus), type 2 ([place])    dIET CONTROLLED   Environmental and seasonal allergies    GERD (gastroesophageal reflux disease)    Hyperlipidemia associated with type 2 diabetes mellitus ([place])    Hypertension    Uterine polyp     Past Surgical History:  Procedure Laterality Date   DILATATION & CURETTAGE/HYSTEROSCOPY WITH MYOSURE N/A 06/04/2019   Procedure: DILATATION & CURETTAGE/HYSTEROSCOPY WITH MYOSURE;  Surgeon: Janyth Pupa, DO;  Location: [facility];  Service: Gynecology;  Laterality: N/A;  [place] Myosure Rep will be here.  Confirmed on 05/29/19 CS   EXCISION OF SKIN TAG Right 06/04/2019   Procedure: EXCISION OF SKIN TAG;  Surgeon: Janyth Pupa, DO;  Location: [facility];  Service: Gynecology;  Laterality: Right;   FOOT SURGERY Right    NM MYOVIEW LTD  08/20/2020    Lexiscan Myoview:  EF 60-70%.  T wave inversion in inferior lateral leads Lexiscan.  Small sized mild severity defect in the mid anterior and anterolateral location that improved with stress.  Not consistent with ischemia.  Felt to be   Related to breast attenuation artifact.    Current Medications: No outpatient medications have been marked as taking for the 12/27/22 encounter (Appointment) with Deberah Pelton, NP.     Allergies:   Iodinated contrast media, Latex, Amlodipine besylate, Amoxicillin, Cephalexin, Codeine, Crestor [rosuvastatin], Hydrocodone, and Olmesartan   Social History   Socioeconomic History   Marital status: Married    Spouse name: Not on file   Number of children: 5   Years of education: Not on file   Highest education level: Not on file  Occupational History   Not on file  Tobacco Use   Smoking status: Never   Smokeless tobacco: Never  Vaping Use   Vaping Use: Never used  Substance and Sexual Activity   Alcohol use: No   Drug use: No   Sexual activity: Not on file  Other Topics Concern   Not on file  Social History Narrative   Daedra and her husband had 5 children, and her basely raising their oldest grandson.  This is the son of their oldest daughter.  There is a very strained relationship between them and their daughter, who does not seem to  be appreciative of the efforts they made to take care of her son.  She apparently is very abusive verbally and emotionally to them.  This caused her significant medical distress.  She oftentimes goes to bed upset.   Social Determinants of Health   Financial Resource Strain: Not on file  Food Insecurity: Not on file  Transportation Needs: Not on file  Physical Activity: Not on file  Stress: Not on file  Social Connections: Not on file     Family History: The patient's family history is negative for Breast cancer.  ROS:   Please see the history of present illness.     All other systems reviewed and are  negative.  EKGs/Labs/Other Studies Reviewed:    The following studies were reviewed today:  Lexiscan Myoview 08/20/2020: EF 60-70%.  T wave inversion in inferior lateral leads Lexiscan.  Small sized mild severity defect in the mid anterior and anterolateral location that improved with stress.  Not consistent with ischemia.  Felt to be related to breast attenuation artifact.  EKG:  EKG is *** ordered today.  The ekg ordered today demonstrates ***  Recent Labs: No results found for requested labs within last 365 days.  Recent Lipid Panel    Component Value Date/Time   CHOL 124 07/16/2021 1149   TRIG 124 07/16/2021 1149   HDL 40 07/16/2021 1149   CHOLHDL 3.1 07/16/2021 1149   LDLCALC 62 07/16/2021 1149     Risk Assessment/Calculations:   {Does this patient have ATRIAL FIBRILLATION?:[phone]}  No BP recorded.  {Refresh Note OR Click here to enter BP  :1}***         Physical Exam:    VS:  There were no vitals taken for this visit.    Wt Readings from Last 3 Encounters:  08/04/21 261 lb 9.6 oz (118.7 kg)  07/16/21 265 lb (120.2 kg)  03/11/21 250 lb 12.8 oz (113.8 kg)     GEN: *** Well nourished, well developed in no acute distress HEENT: Normal NECK: No JVD; No carotid bruits LYMPHATICS: No lymphadenopathy CARDIAC: ***RRR, no murmurs, rubs, gallops RESPIRATORY:  Clear to auscultation without rales, wheezing or rhonchi  ABDOMEN: Soft, non-tender, non-distended MUSCULOSKELETAL:  No edema; No deformity  SKIN: Warm and dry NEUROLOGIC:  Alert and oriented x 3 PSYCHIATRIC:  Normal affect   ASSESSMENT:    No diagnosis found. PLAN:    In order of problems listed above:  HTN - BP today  ,  HLD -       {Are you ordering a CV Procedure (e.g. stress test, cath, DCCV, TEE, etc)?   Press F2        :[id number]    Medication Adjustments/Labs and Tests Ordered: Current medicines are reviewed at length with the patient today.  Concerns regarding medicines are outlined  above.  No orders of the defined types were placed in this encounter.  No orders of the defined types were placed in this encounter.   There are no Patient Instructions on file for this visit.   Signed, Trudi Ida, NP  12/26/2022 5:16 PM    [place]

## 2022-12-27 ENCOUNTER — Ambulatory Visit: Payer: 59 | Attending: Cardiology | Admitting: General Practice

## 2022-12-27 DIAGNOSIS — E782 Mixed hyperlipidemia: Secondary | ICD-10-CM

## 2022-12-27 DIAGNOSIS — I10 Essential (primary) hypertension: Secondary | ICD-10-CM

## 2023-01-03 ENCOUNTER — Inpatient Hospital Stay: Admission: RE | Admit: 2023-01-03 | Payer: Medicare Other | Source: Ambulatory Visit

## 2023-01-10 ENCOUNTER — Ambulatory Visit
Admission: RE | Admit: 2023-01-10 | Discharge: 2023-01-10 | Disposition: A | Discharge status: Home / Self Care | Payer: 59 | Source: Ambulatory Visit | Attending: Family Medicine | Admitting: Family Medicine

## 2023-01-10 DIAGNOSIS — Z1231 Encounter for screening mammogram for malignant neoplasm of breast: Secondary | ICD-10-CM

## 2023-01-12 ENCOUNTER — Other Ambulatory Visit: Payer: Self-pay | Admitting: Cardiology

## 2023-01-20 ENCOUNTER — Other Ambulatory Visit: Payer: Self-pay | Admitting: Cardiology

## 2023-01-28 ENCOUNTER — Other Ambulatory Visit: Payer: Self-pay | Admitting: Cardiology

## 2023-02-19 ENCOUNTER — Other Ambulatory Visit: Payer: Self-pay | Admitting: Cardiology

## 2023-02-23 NOTE — Telephone Encounter (Signed)
*  STAT* If patient is at the pharmacy, call can be transferred to refill team.   1. Which medications need to be refilled? (please list name of each medication and dose if known) Amlodipine  2. Which pharmacy/location (including street and city if local pharmacy) is medication to be sent to? [facility] [place], [place]  3. Do they need a 30 day or 90 day supply? 90 days and refills

## 2023-02-24 ENCOUNTER — Other Ambulatory Visit: Payer: Self-pay | Admitting: Cardiology

## 2023-02-25 ENCOUNTER — Telehealth: Payer: Self-pay | Admitting: Cardiology

## 2023-02-25 MED ORDER — OLMESARTAN MEDOXOMIL 40 MG PO TABS: 40.0000 mg | ORAL_TABLET | Freq: Every day | ORAL | 0 refills | Status: DC

## 2023-02-25 NOTE — Telephone Encounter (Signed)
*  STAT* If patient is at the pharmacy, call can be transferred to refill team.   1. Which medications need to be refilled? (please list name of each medication and dose if known) olmesartan (BENICAR) 40 MG tablet   2. Which pharmacy/location (including street and city if local pharmacy) is medication to be sent to?  [place], [place] - [id number]    3. Do they need a 30 day or 90 day supply? 90  Patient has appt 4/22

## 2023-02-25 NOTE — Telephone Encounter (Signed)
Pt scheduled appointment 03/28/23, refill for Olmesartan has been sent to [facility].

## 2023-03-08 ENCOUNTER — Other Ambulatory Visit: Payer: Self-pay | Admitting: Cardiology

## 2023-03-14 ENCOUNTER — Other Ambulatory Visit: Payer: Self-pay | Admitting: Cardiology

## 2023-03-16 DIAGNOSIS — E7800 Pure hypercholesterolemia, unspecified: Secondary | ICD-10-CM | POA: Diagnosis not present

## 2023-03-16 DIAGNOSIS — E1165 Type 2 diabetes mellitus with hyperglycemia: Secondary | ICD-10-CM | POA: Diagnosis not present

## 2023-03-16 DIAGNOSIS — I10 Essential (primary) hypertension: Secondary | ICD-10-CM | POA: Diagnosis not present

## 2023-03-19 ENCOUNTER — Other Ambulatory Visit: Payer: Self-pay | Admitting: Cardiology

## 2023-03-21 DIAGNOSIS — D8481 Immunodeficiency due to conditions classified elsewhere: Secondary | ICD-10-CM | POA: Diagnosis not present

## 2023-03-21 DIAGNOSIS — Z9181 History of falling: Secondary | ICD-10-CM | POA: Diagnosis not present

## 2023-03-21 DIAGNOSIS — E119 Type 2 diabetes mellitus without complications: Secondary | ICD-10-CM | POA: Diagnosis not present

## 2023-03-21 DIAGNOSIS — Z0000 Encounter for general adult medical examination without abnormal findings: Secondary | ICD-10-CM | POA: Diagnosis not present

## 2023-03-21 DIAGNOSIS — I10 Essential (primary) hypertension: Secondary | ICD-10-CM | POA: Diagnosis not present

## 2023-03-21 DIAGNOSIS — J4530 Mild persistent asthma, uncomplicated: Secondary | ICD-10-CM | POA: Diagnosis not present

## 2023-03-21 DIAGNOSIS — E7800 Pure hypercholesterolemia, unspecified: Secondary | ICD-10-CM | POA: Diagnosis not present

## 2023-03-22 ENCOUNTER — Other Ambulatory Visit: Payer: Self-pay | Admitting: Family Medicine

## 2023-03-22 DIAGNOSIS — E2839 Other primary ovarian failure: Secondary | ICD-10-CM

## 2023-03-22 NOTE — Progress Notes (Signed)
Cardiology Office Note:    Date:  03/28/2023   ID:  Suzanne Patterson, Suzanne Patterson [birth year], MRN [id number]  PCP:  [name], MD  Cardiologist:  Bryan Lemma, MD  Electrophysiologist:  None   Referring MD: [name], MD   Chief Complaint: follow-up of hypertension  History of Present Illness:    Suzanne Patterson is a 72 y.o. female with a history of atypical chest pain with negative Myoview in 08/2020, hypertension, hyperlipidemia intolerant to statins, type 2 diabetes mellitus, and GERD who is followed by Dr. Herbie Baltimore and presents today for routine follow-up of hypertension.  Patient was referred to Dr. Herbie Baltimore in 07/2020 for further evaluation of chest pain. Initial plan was for a coronary CTA but patient's insurance would not cover this so Lexiscan Myoview was performed and was low risk with no evidence of ischemia. Chest pain was suspected to be musculoskeletal in nature. Since then, she has mainly been followed for hypertension. She was last seen by Dr. Herbie Baltimore in 03/2021 at which time she had been having problems with uncontrolled hypertension with BP as high as the 190s/110s. She was having a lot of mouth/ dental pain at the time which was felt to be contributing.  Medications were adjusted and she was [facility] in [facility] multiple times following this with further adjustment of medications.   Current antihypertensive regimen is: Amlodipine  daily, Coreg 6.25mg  twice daily, Chlorthalidone  daily, Olmesartan  daily. Of note, patient patient has Amlodipine and Olmesartan listed as allergies with both reactions listed as swelling. However, per review of prior Cardiology notes, swelling was not felt to be due to medications.  Patient presents today for follow-up. Here with her husband. She has been doing well from a cardiac standpoint. She denies any chest pain. She will occasionally have some shortness of breath with an asthma flare but otherwise  no dyspnea. No orthopnea or PND. No significant lower extremity edema now that she monitors her salt intake. She describes a little dizziness with quick position changes but no palpitations or near syncope/ syncope. BP initially mildly elevated at 140/88 but then improved to 130/80 on my personal recheck at the end of visit. She was recently seen by her PCP (Dr Katrinka Blazing) on 03/21/2023 and BP was also well controlled at that time at 125/79. However, husband states these are the best BP readings she has ever had. It is not unusual for her BP to be in the 160s/90s. However, she does not check her BP regularly at home. It does not look like she has ever had a work-up for secondary hypertension. She does snore at night and describes apneic episodes and daytime fatigue. I suspect she has sleep apnea but she states she does not want to wear a CPAP machine. Patient states her BP is due to the trauma she experienced as a child.  Past Medical History:  Diagnosis Date   Anxiety    Depression    DM (diabetes mellitus), type 2    dIET CONTROLLED   Environmental and seasonal allergies    GERD (gastroesophageal reflux disease)    Hyperlipidemia associated with type 2 diabetes mellitus    Hypertension    Uterine polyp     Past Surgical History:  Procedure Laterality Date   DILATATION & CURETTAGE/HYSTEROSCOPY WITH MYOSURE N/A 06/04/2019   Procedure: DILATATION & CURETTAGE/HYSTEROSCOPY WITH MYOSURE;  Surgeon: Myna Hidalgo, DO;  Location: [facility];  Service: Gynecology;  Laterality: N/A;  [place] Myosure Rep  will be here.  Confirmed on 05/29/19 CS   EXCISION OF SKIN TAG Right 06/04/2019   Procedure: EXCISION OF SKIN TAG;  Surgeon: Myna Hidalgo, DO;  Location: [facility];  Service: Gynecology;  Laterality: Right;   FOOT SURGERY Right    NM MYOVIEW LTD  08/20/2020   Lexiscan Myoview:  EF 60-70%.  T wave inversion in inferior lateral leads Lexiscan.  Small sized mild severity defect in  the mid anterior and anterolateral location that improved with stress.  Not consistent with ischemia.  Felt to be   Related to breast attenuation artifact.    Current Medications: Current Meds  Medication Sig   acetaminophen (TYLENOL) 650 MG CR tablet Take 1 tablet by mouth as needed.   albuterol (VENTOLIN HFA) 108 (90 Base) MCG/ACT inhaler Inhale into the lungs every 6 (six) hours as needed for wheezing or shortness of breath.   ALPRAZolam (XANAX) 0.5 MG tablet Take 0.5 mg by mouth at bedtime as needed for anxiety.   amLODipine (NORVASC) 5 MG tablet Take 1 tablet by mouth once daily   aspirin 81 MG chewable tablet Chew 81 mg by mouth daily.   Blood Glucose Calibration (ACCU-CHEK AVIVA) SOLN Apply 1 applicator topically See admin instructions.   Blood Glucose Monitoring Suppl (ACCU-CHEK AVIVA PLUS) w/Device KIT Apply 1 applicator topically See admin instructions.   carvedilol (COREG) 6.25 MG tablet TAKE 1 TABLET BY MOUTH TWICE DAILY WITH A MEAL   chlorthalidone (HYGROTON) 25 MG tablet Take 1 tablet by mouth once daily   co-enzyme Q-10 30 MG capsule Take 30 mg by mouth 3 (three) times daily.   escitalopram (LEXAPRO) 5 MG tablet Take 5 mg by mouth daily.   ezetimibe (ZETIA) 10 MG tablet TAKE 1 TABLET BY MOUTH ONCE DAILY . APPOINTMENT REQUIRED FOR FUTURE REFILLS   famotidine (PEPCID) 10 MG tablet Take 10 mg by mouth 2 (two) times daily.   fluconazole (DIFLUCAN) 150 MG tablet Take 150 mg by mouth once.   ibuprofen (ADVIL) 200 MG tablet Take 1 tablet by mouth as needed.   metFORMIN (GLUCOPHAGE-XR) 500 MG 24 hr tablet daily at 6 (six) AM.   olmesartan (BENICAR) 40 MG tablet Take 1 tablet by mouth once daily   ondansetron (ZOFRAN) 4 MG tablet Take 1 tablet by mouth as needed.   Probiotic Product (PROBIOTIC ADVANCED PO) Take by mouth.   rosuvastatin (CRESTOR) 5 MG tablet Take 5 mg by mouth every Monday, Wednesday, and Friday.     Allergies:   Iodinated contrast media, Latex, Amlodipine besylate,  Amoxicillin, Cephalexin, Codeine, Crestor [rosuvastatin], Hydrocodone, and Olmesartan   Social History   Socioeconomic History   Marital status: Married    Spouse name: Not on file   Number of children: 5   Years of education: Not on file   Highest education level: Not on file  Occupational History   Not on file  Tobacco Use   Smoking status: Never   Smokeless tobacco: Never  Vaping Use   Vaping Use: Never used  Substance and Sexual Activity   Alcohol use: No   Drug use: No   Sexual activity: Not on file  Other Topics Concern   Not on file  Social History Narrative   Aftin and her husband had 5 children, and her basely raising their oldest grandson.  This is the son of their oldest daughter.  There is a very strained relationship between them and their daughter, who does not seem to be appreciative of the  efforts they made to take care of her son.  She apparently is very abusive verbally and emotionally to them.  This caused her significant medical distress.  She oftentimes goes to bed upset.   Social Determinants of Health   Financial Resource Strain: Not on file  Food Insecurity: Not on file  Transportation Needs: Not on file  Physical Activity: Not on file  Stress: Not on file  Social Connections: Not on file     Family History: The patient's family history is negative for Breast cancer.  ROS:   Please see the history of present illness.     EKGs/Labs/Other Studies Reviewed:    The following studies were reviewed:  Myoview 08/20/2020: The left ventricular ejection fraction is hyperdynamic (>65%). Nuclear stress EF: 69%. T wave inversion was noted during stress in the II, III, aVR, aVF and V6 leads. Defect 1: There is a small defect of mild severity present in the mid anterior and mid anterolateral location. The study is normal. This is a low risk study.   Low risk study, no ischemia. There is a mild mid anterior/anterolateral defect at rest that improves  with stress. Based on counts/imaging, this is most likely artifact from tissue attenuation. No defects seen on stress imaging.   EKG:  EKG ordered today. EKG personally reviewed and demonstrates normal sinus rhythm, rate 67 bpm, with 1st degree AV block and low voltage QRS bu no acute ST/T changes. QTc 443 ms.  Recent Labs: No results found for requested labs within last 365 days.  Recent Lipid Panel    Component Value Date/Time   CHOL 124 07/16/2021 1149   TRIG 124 07/16/2021 1149   HDL 40 07/16/2021 1149   CHOLHDL 3.1 07/16/2021 1149   LDLCALC 62 07/16/2021 1149    Physical Exam:    Vital Signs: BP 130/80   Pulse 67   Ht  (1.626 m)   Wt 253 lb 9.6 oz (115 kg)   SpO2 97%   BMI 43.53 kg/m     Wt Readings from Last 3 Encounters:  03/28/23 253 lb 9.6 oz (115 kg)  08/04/21 261 lb 9.6 oz (118.7 kg)  07/16/21 265 lb (120.2 kg)     General: 72 y.o. morbidly obese Caucasian female in no acute distress. HEENT: Normocephalic and atraumatic. Sclera clear. EOMs intact. Neck: Supple. No JVD. Heart:  RRR. Distinct S1 and S2. No murmurs, gallops, or rubs.  Lungs: No increased work of breathing. Clear to ausculation bilaterally. No wheezes, rhonchi, or rales.  Abdomen: Soft, non-distended, and non-tender to palpation. Extremities: No significant lower extremity edema.    Skin: Warm and dry. Neuro: Alert and oriented x3. No focal deficits. Psych: Normal affect. Responds appropriately.  Assessment:    1. Primary hypertension   2. Hyperlipidemia, unspecified hyperlipidemia type   3. Type 2 diabetes mellitus with obesity   4. History of atypical chest pain   5. Snoring     Plan:    Hypertension BP initially mildly elevated at 140/88 but the improved to 13/80 on my personal recheck at the end of visit. However, it sounds like BP is not usually this well controlled at home. Patient does not take her BP regularly at home but it sounds like it is not unusual for BP to run in the  160s/90s. - Continue current medications: Amlodipine  daily, Coreg 6.25mg  twice daily, Chlorthalidone  daily, Olmesartan  daily. - Asked patient to keep a BP/HR log for 2 weeks and then send  this to [place].  - Recent BMET on 03/16/2023 at PCP's office showed stable renal function with creatinine of 0.20 and potassium of 4.0.   - Of note, patient patient has Amlodipine and Olmesartan listed as allergies with both reactions listed as swelling. However, per review of prior Cardiology notes, swelling was not felt to be due to medications. Discussed this with patient today and she also states the swelling was not related to the medications.   Hyperlipidemia Recent lipid panel on 03/16/2023 at PCPs office: Total Cholesterol 135, Triglycerides 168, HDL 41, LDL 65.  - Continue Crestor 5mg  Monday/ Wednesday/ Friday. Unable to tolerate higher dose of this. - Continue Zetia 10mg  daily.  - Labs followed by PCP.  Type 2 Diabetes Mellitus Hemoglobin A1c 10.4 % on 03/16/2023 at PCPs office. - Started on Metformin at PCP visit last week. - Management per PCP.  History of Atypical Chest Pain Patient has a history of atypical chest pain. Lexiscan Myoview in 08/2020 was low risk with no evidence of ischemia. Chest pain was suspected to be musculoskeletal in nature.  - No recurrent chest pain.  Snoring Patient describes snoring, apneic episodes, and daytime fatigue. BMI 43.53. Suspect she has obstructive sleep apnea.  - Discussed Itmar home sleep study but patient is pretty clear that she does not want to wear a CPAP machine. Advised her to let us know if she changes her mind and would like for [place] to order a sleep study.  Disposition: Follow up in 6 months.   Medication Adjustments/Labs and Tests Ordered: Current medicines are reviewed at length with the patient today.  Concerns regarding medicines are outlined above.  Orders Placed This Encounter  Procedures   EKG 12-Lead   No orders of the  defined types were placed in this encounter.   Patient Instructions  Medication Instructions:   Your physician recommends that you continue on your current medications as directed. Please refer to the Current Medication list given to you today.  *If you need a refill on your cardiac medications before your next appointment, please call your pharmacy*  Lab Work: NONE ordered at this time of appointment   If you have labs (blood work) drawn today and your tests are completely normal, you will receive your results only by: MyChart Message (if you have MyChart) OR A paper copy in the mail If you have any lab test that is abnormal or we need to change your treatment, we will call you to review the results.  Testing/Procedures: NONE ordered at this time of appointment   Follow-Up: At [facility], you and your health needs are our priority.  As part of our continuing mission to provide you with exceptional heart care, we have created designated Provider Care Teams.  These Care Teams include your primary Cardiologist (physician) and Advanced Practice Providers (APPs -  Physician Assistants and Nurse Practitioners) who all work together to provide you with the care you need, when you need it.  We recommend signing up for the patient portal called "MyChart".  Sign up information is provided on this After Visit Summary.  MyChart is used to connect with patients for Virtual Visits (Telemedicine).  Patients are able to view lab/test results, encounter notes, upcoming appointments, etc.  Non-urgent messages can be sent to your provider as well.   To learn more about what you can do with MyChart, go to [email or web address].    Your next appointment:   6 month(s)  Provider:   Bryan Lemma, MD  or Marjie Skiff, PA-C        Other Instructions MONITOR blood pressure at home daily     Signed, Corrin Parker, PA-C  03/28/2023 1:11 PM    [facility]

## 2023-03-24 ENCOUNTER — Other Ambulatory Visit: Payer: Self-pay | Admitting: Cardiology

## 2023-03-28 ENCOUNTER — Telehealth: Payer: Self-pay | Admitting: Cardiology

## 2023-03-28 ENCOUNTER — Ambulatory Visit: Payer: 59 | Attending: Student | Admitting: Student

## 2023-03-28 ENCOUNTER — Encounter: Payer: Self-pay | Admitting: Student

## 2023-03-28 VITALS — BP 130/80 | HR 67 | Ht 64.0 in | Wt 253.6 lb

## 2023-03-28 DIAGNOSIS — E669 Obesity, unspecified: Secondary | ICD-10-CM

## 2023-03-28 DIAGNOSIS — E785 Hyperlipidemia, unspecified: Secondary | ICD-10-CM

## 2023-03-28 DIAGNOSIS — I10 Essential (primary) hypertension: Secondary | ICD-10-CM

## 2023-03-28 DIAGNOSIS — R0789 Other chest pain: Secondary | ICD-10-CM

## 2023-03-28 DIAGNOSIS — E1169 Type 2 diabetes mellitus with other specified complication: Secondary | ICD-10-CM

## 2023-03-28 DIAGNOSIS — R0683 Snoring: Secondary | ICD-10-CM

## 2023-03-28 NOTE — Telephone Encounter (Signed)
I asked provider did they find glasses in the room and she stated not that she is aware of. She will continue to look.  Patient is aware we will give her a call if we find the glasses.

## 2023-03-28 NOTE — Patient Instructions (Addendum)
Medication Instructions:   Your physician recommends that you continue on your current medications as directed. Please refer to the Current Medication list given to you today.  *If you need a refill on your cardiac medications before your next appointment, please call your pharmacy*  Lab Work: NONE ordered at this time of appointment   If you have labs (blood work) drawn today and your tests are completely normal, you will receive your results only by: MyChart Message (if you have MyChart) OR A paper copy in the mail If you have any lab test that is abnormal or we need to change your treatment, we will call you to review the results.  Testing/Procedures: NONE ordered at this time of appointment   Follow-Up: At [facility], you and your health needs are our priority.  As part of our continuing mission to provide you with exceptional heart care, we have created designated Provider Care Teams.  These Care Teams include your primary Cardiologist (physician) and Advanced Practice Providers (APPs -  Physician Assistants and Nurse Practitioners) who all work together to provide you with the care you need, when you need it.  We recommend signing up for the patient portal called "MyChart".  Sign up information is provided on this After Visit Summary.  MyChart is used to connect with patients for Virtual Visits (Telemedicine).  Patients are able to view lab/test results, encounter notes, upcoming appointments, etc.  Non-urgent messages can be sent to your provider as well.   To learn more about what you can do with MyChart, go to [email or web address].    Your next appointment:   6 month(s)  Provider:   Bryan Lemma, MD  or Marjie Skiff, PA-C        Other Instructions MONITOR blood pressure at home daily

## 2023-03-28 NOTE — Telephone Encounter (Signed)
Patient states she misplaced her glass and she thinks that she may have left them in the office during her appointment with Marjie Skiff today. She states they are red wire-framed prescription reader glasses.

## 2023-04-11 ENCOUNTER — Other Ambulatory Visit: Payer: Self-pay | Admitting: Cardiology

## 2023-04-17 ENCOUNTER — Other Ambulatory Visit: Payer: Self-pay | Admitting: Cardiology

## 2023-06-11 ENCOUNTER — Other Ambulatory Visit: Payer: Self-pay | Admitting: Cardiology

## 2023-06-16 ENCOUNTER — Other Ambulatory Visit: Payer: Self-pay | Admitting: Cardiology

## 2023-06-17 ENCOUNTER — Other Ambulatory Visit: Payer: Self-pay | Admitting: Cardiology

## 2023-08-11 DIAGNOSIS — I10 Essential (primary) hypertension: Secondary | ICD-10-CM | POA: Diagnosis not present

## 2023-08-11 DIAGNOSIS — E119 Type 2 diabetes mellitus without complications: Secondary | ICD-10-CM | POA: Diagnosis not present

## 2023-08-11 DIAGNOSIS — J4530 Mild persistent asthma, uncomplicated: Secondary | ICD-10-CM | POA: Diagnosis not present

## 2023-08-11 DIAGNOSIS — Z23 Encounter for immunization: Secondary | ICD-10-CM | POA: Diagnosis not present

## 2023-08-11 DIAGNOSIS — G43909 Migraine, unspecified, not intractable, without status migrainosus: Secondary | ICD-10-CM | POA: Diagnosis not present

## 2023-10-04 ENCOUNTER — Other Ambulatory Visit: Payer: Self-pay | Admitting: Cardiology

## 2023-11-28 ENCOUNTER — Ambulatory Visit
Admission: RE | Admit: 2023-11-28 | Discharge: 2023-11-28 | Disposition: A | Discharge status: Home / Self Care | Payer: 59 | Source: Ambulatory Visit | Attending: Family Medicine | Admitting: Family Medicine

## 2023-11-28 DIAGNOSIS — E2839 Other primary ovarian failure: Secondary | ICD-10-CM

## 2023-12-20 ENCOUNTER — Telehealth: Payer: Self-pay | Admitting: Cardiology

## 2023-12-20 ENCOUNTER — Other Ambulatory Visit: Payer: Self-pay | Admitting: Cardiology

## 2023-12-20 NOTE — Telephone Encounter (Signed)
*  STAT* If patient is at the pharmacy, call can be transferred to refill team.   1. Which medications need to be refilled? (please list name of each medication and dose if known) carvedilol  (COREG ) 6.25 MG tablet    2. Would you like to learn more about the convenience, safety, & potential cost savings by using the [facility]?    3. Are you open to using the [facility] (Type [facility]. ).   4. Which pharmacy/location (including street and city if local pharmacy) is medication to be sent to? [facility] [address] [place] - [address]    5. Do they need a 30 day or 90 day supply? 90   Patient needs medication before leaving out of town

## 2023-12-21 MED ORDER — CARVEDILOL 6.25 MG PO TABS: 6.2500 mg | ORAL_TABLET | Freq: Two times a day (BID) | ORAL | 0 refills | Status: DC

## 2024-01-27 ENCOUNTER — Ambulatory Visit: Payer: 59 | Admitting: Cardiology

## 2024-02-09 DIAGNOSIS — E1169 Type 2 diabetes mellitus with other specified complication: Secondary | ICD-10-CM | POA: Diagnosis not present

## 2024-02-09 DIAGNOSIS — E119 Type 2 diabetes mellitus without complications: Secondary | ICD-10-CM | POA: Diagnosis not present

## 2024-02-09 DIAGNOSIS — J45909 Unspecified asthma, uncomplicated: Secondary | ICD-10-CM | POA: Diagnosis not present

## 2024-02-09 DIAGNOSIS — Z23 Encounter for immunization: Secondary | ICD-10-CM | POA: Diagnosis not present

## 2024-02-09 DIAGNOSIS — E7800 Pure hypercholesterolemia, unspecified: Secondary | ICD-10-CM | POA: Diagnosis not present

## 2024-02-09 DIAGNOSIS — I10 Essential (primary) hypertension: Secondary | ICD-10-CM | POA: Diagnosis not present

## 2024-03-02 NOTE — Progress Notes (Deleted)
 Cardiology Office Note:    Date:  03/02/2024   ID:  Suzanne Patterson, [name] 14-Aug-1951, MRN [id number]  PCP:  [name], MD  Cardiologist:  Bryan Lemma, MD { Click to update primary MD,subspecialty MD or APP then REFRESH:1}    Referring MD: [name], MD   Chief Complaint: follow-up of hypertension  History of Present Illness:    Suzanne Patterson is a 73 y.o. female with a history of atypical chest pain with negative Myoview in 08/2020, hypertension, hyperlipidemia intolerant to statins, type 2 diabetes mellitus, and GERD who is followed by Dr. Herbie Baltimore and presents today for routine follow-up.  Patient was referred to Dr. Herbie Baltimore in 07/2020 for further evaluation of chest pain. Initial plan was for a coronary CTA but patient's insurance would not cover this so Lexiscan Myoview was performed and was low risk with no evidence of ischemia. Chest pain was suspected to be musculoskeletal in nature. Since then, she has mainly been followed for hypertension. She has been seen multiple times in the [facility] Hypertension clinic.   She was last seen by me in 03/2023 at which time she was doing well from a cardiac standpoint. BP was well controlled in the office. However, she stated it was not uncommon for her BP to run in the 160s/ 90s at home but admitted to not checking her BP regularly. She was continue on her home medications and asked to keep a BP/HR log for 2 weeks.  Patient presents today for follow-up. ***  Hypertension BP *** - Continue current medications: Amlodipine 5mg  daily, Coreg 6.25mg  twice daily, Chlorthalidone 25mg  daily, Olmesartan 40mg  daily.   Of note, patient patient has Amlodipine and Olmesartan listed as allergies with both reactions listed as swelling. However, per review of prior Cardiology notes, swelling was not felt to be due to medications. I discussed this with patient at last office visit on 03/28/2023 and she also stated the swelling was not  related to the medications.    Hyperlipidemia Recent lipid panel on 03/16/2023 at PCPs office: Total Cholesterol 135, Triglycerides 168, HDL 41, LDL 65. *** - Continue Crestor 5mg  Monday/ Wednesday/ Friday. Unable to tolerate higher dose of this. - Continue Zetia 10mg  daily.  - Labs followed by PCP.   Type 2 Diabetes Mellitus Hemoglobin A1c *** - On Metformin. - Management per PCP.   History of Atypical Chest Pain Patient has a history of atypical chest pain. Lexiscan Myoview in 08/2020 was low risk with no evidence of ischemia. Chest pain was suspected to be musculoskeletal in nature.  - No recurrent chest pain.   Snoring *** Patient describes snoring, apneic episodes, and daytime fatigue. BMI 43.53. Suspect she has obstructive sleep apnea.  - Discussed Itmar home sleep study but patient is pretty clear that she does not want to wear a CPAP machine. Advised her to let us know if she changes her mind and would like for [place] to order a sleep study.     EKGs/Labs/Other Studies Reviewed:    The following studies were reviewed:  Myoview 08/20/2020: The left ventricular ejection fraction is hyperdynamic (>65%). Nuclear stress EF: 69%. T wave inversion was noted during stress in the II, III, aVR, aVF and V6 leads. Defect 1: There is a small defect of mild severity present in the mid anterior and mid anterolateral location. The study is normal. This is a low risk study.   Low risk study, no ischemia. There is a mild mid anterior/anterolateral defect at rest that  improves with stress. Based on counts/imaging, this is most likely artifact from tissue attenuation. No defects seen on stress imaging.  EKG:  EKG ordered today. EKG personally reviewed and demonstrates ***.  Recent Labs: No results found for requested labs within last 365 days.  Recent Lipid Panel    Component Value Date/Time   CHOL 124 07/16/2021 1149   TRIG 124 07/16/2021 1149   HDL 40 07/16/2021 1149   CHOLHDL 3.1  07/16/2021 1149   LDLCALC 62 07/16/2021 1149    Physical Exam:    Vital Signs: There were no vitals taken for this visit.    Wt Readings from Last 3 Encounters:  03/28/23 253 lb 9.6 oz (115 kg)  08/04/21 261 lb 9.6 oz (118.7 kg)  07/16/21 265 lb (120.2 kg)     General: 73 y.o. female in no acute distress. HEENT: Normocephalic and atraumatic. Sclera clear.  Neck: Supple. No carotid bruits. No JVD. Heart: *** RRR. Distinct S1 and S2. No murmurs, gallops, or rubs.  Lungs: No increased work of breathing. Clear to ausculation bilaterally. No wheezes, rhonchi, or rales.  Abdomen: Soft, non-distended, and non-tender to palpation.  Extremities: No lower extremity edema.  Radial and distal pedal pulses 2+ and equal bilaterally. Skin: Warm and dry. Neuro: No focal deficits. Psych: Normal affect. Responds appropriately.   Assessment:    No diagnosis found.  Plan:     Disposition: Follow up in ***   Signed, Corrin Parker, PA-C  03/02/2024 4:14 PM     [facility]

## 2024-03-13 ENCOUNTER — Ambulatory Visit: Payer: 59 | Admitting: Student

## 2024-04-15 NOTE — Progress Notes (Deleted)
 Cardiology Office Note:    Date:  04/15/2024   ID:  Suzanne Patterson, DOB 1951-07-17, MRN [id number]  PCP:  Faustina Hood, MD  Cardiologist:  Randene Bustard, MD { Click to update primary MD,subspecialty MD or APP then REFRESH:1}    Referring MD: Faustina Hood, MD   Chief Complaint: follow-up of hypertension   History of Present Illness:    Suzanne Patterson is a 73 y.o. female with a history of atypical chest pain with negative Myoview  in 08/2020, hypertension, hyperlipidemia intolerant to statins, type 2 diabetes mellitus, and GERD who is followed by Dr. Addie Holstein and presents today for routine follow-up.  Patient was referred to Dr. Addie Holstein in 07/2020 for further evaluation of chest pain. Initial plan was for a coronary CTA but patient's insurance would not cover this so Lexiscan  Myoview  was performed and was low risk with no evidence of ischemia. Chest pain was suspected to be musculoskeletal in nature. Since then, she has mainly been followed for hypertension. She has been seen multiple times in the Pharm[facility] Hypertension clinic.   She was last seen by me in 03/2023 at which time she was doing well from a cardiac standpoint. BP was well controlled in the office. However, she stated it was not uncommon for her BP to run in the 160s/ 90s at home but admitted to not checking her BP regularly. She was continue on her home medications and asked to keep a BP/HR log for 2 weeks.  Patient presents today for follow-up. ***  Hypertension BP *** - Continue current medications: Amlodipine  5mg  daily, Coreg  6.25mg  twice daily, Chlorthalidone  25mg  daily, Olmesartan  40mg  daily.   Of note, patient patient has Amlodipine  and Olmesartan  listed as allergies with both reactions listed as swelling. However, per review of prior Cardiology notes, swelling was not felt to be due to medications. I discussed this with patient at last office visit on 03/28/2023 and she also stated the swelling was not  related to the medications.    Hyperlipidemia Recent lipid panel on 03/16/2023 at PCPs office: Total Cholesterol 135, Triglycerides 168, HDL 41, LDL 65. *** - Continue Crestor 5mg  Monday/ Wednesday/ Friday. Unable to tolerate higher dose of this. - Continue Zetia  10mg  daily.  - Labs followed by PCP.   Type 2 Diabetes Mellitus Hemoglobin A1c *** - On Metformin. - Management per PCP.   History of Atypical Chest Pain Patient has a history of atypical chest pain. Lexiscan  Myoview  in 08/2020 was low risk with no evidence of ischemia. Chest pain was suspected to be musculoskeletal in nature.  - No recurrent chest pain.   Snoring *** Patient describes snoring, apneic episodes, and daytime fatigue. BMI 43.53. Suspect she has obstructive sleep apnea.  - Discussed Itmar home sleep study but patient is pretty clear that she does not want to wear a CPAP machine. Advised her to let us  know if she changes her mind and would like for us  to order a sleep study.  EKGs/Labs/Other Studies Reviewed:    The following studies were reviewed:  Myoview  08/20/2020: The left ventricular ejection fraction is hyperdynamic (>65%). Nuclear stress EF: 69%. T wave inversion was noted during stress in the II, III, aVR, aVF and V6 leads. Defect 1: There is a small defect of mild severity present in the mid anterior and mid anterolateral location. The study is normal. This is a low risk study.   Low risk study, no ischemia. There is a mild mid anterior/anterolateral defect at rest that improves with  stress. Based on counts/imaging, this is most likely artifact from tissue attenuation. No defects seen on stress imaging.  EKG:  EKG ordered today. EKG personally reviewed and demonstrates ***.  Recent Labs: No results found for requested labs within last 365 days.  Recent Lipid Panel    Component Value Date/Time   CHOL 124 07/16/2021 1149   TRIG 124 07/16/2021 1149   HDL 40 07/16/2021 1149   CHOLHDL 3.1 07/16/2021  1149   LDLCALC 62 07/16/2021 1149    Physical Exam:    Vital Signs: There were no vitals taken for this visit.    Wt Readings from Last 3 Encounters:  03/28/23 253 lb 9.6 oz (115 kg)  08/04/21 261 lb 9.6 oz (118.7 kg)  07/16/21 265 lb (120.2 kg)     General: 73 y.o. female in no acute distress. HEENT: Normocephalic and atraumatic. Sclera clear.  Neck: Supple. No carotid bruits. No JVD. Heart: *** RRR. Distinct S1 and S2. No murmurs, gallops, or rubs.  Lungs: No increased work of breathing. Clear to ausculation bilaterally. No wheezes, rhonchi, or rales.  Abdomen: Soft, non-distended, and non-tender to palpation.  Extremities: No lower extremity edema.  Radial and distal pedal pulses 2+ and equal bilaterally. Skin: Warm and dry. Neuro: No focal deficits. Psych: Normal affect. Responds appropriately.   Assessment:    No diagnosis found.  Plan:     Disposition: Follow up in ***   Signed, Casimer Clear, PA-C  04/15/2024 12:03 PM    [facility]

## 2024-04-25 ENCOUNTER — Ambulatory Visit: Admitting: Student

## 2024-05-23 DIAGNOSIS — I10 Essential (primary) hypertension: Secondary | ICD-10-CM | POA: Diagnosis not present

## 2024-05-23 DIAGNOSIS — Z23 Encounter for immunization: Secondary | ICD-10-CM | POA: Diagnosis not present

## 2024-05-23 DIAGNOSIS — J4530 Mild persistent asthma, uncomplicated: Secondary | ICD-10-CM | POA: Diagnosis not present

## 2024-05-23 DIAGNOSIS — E7800 Pure hypercholesterolemia, unspecified: Secondary | ICD-10-CM | POA: Diagnosis not present

## 2024-05-23 DIAGNOSIS — E1165 Type 2 diabetes mellitus with hyperglycemia: Secondary | ICD-10-CM | POA: Diagnosis not present

## 2024-06-07 NOTE — Progress Notes (Deleted)
 Cardiology Office Note:    Date:  06/07/2024   ID:  Suzanne Patterson, [name] 15-Oct-1951, MRN [id number]  PCP:  Claudene Pellet, MD  Cardiologist:  Alm Clay, MD { Click to update primary MD,subspecialty MD or APP then REFRESH:1}    Referring MD: Claudene Pellet, MD   Chief Complaint: follow-up of hypertension  History of Present Illness:    Suzanne Patterson is a 73 y.o. female with a history of atypical chest pain with negative Myoview  in 08/2020, hypertension, hyperlipidemia intolerant to statins, type 2 diabetes mellitus, and GERD who is followed by Dr. Clay and presents today for routine follow-up.  Patient was referred to Dr. Clay in 07/2020 for further evaluation of chest pain. Initial plan was for a coronary CTA but patient's insurance would not cover this so Lexiscan  Myoview  was performed and was low risk with no evidence of ischemia. Chest pain was suspected to be musculoskeletal in nature. Since then, she has mainly been followed for hypertension. She has been seen multiple times in the PharmD Hypertension clinic.   She was last seen by me in 03/2023 at which time she was doing well from a cardiac standpoint. BP was well controlled in the office. However, she stated it was not uncommon for her BP to run in the 160s/ 90s at home but admitted to not checking her BP regularly. She was continue on her home medications and asked to keep a BP/HR log for 2 weeks.  Patient presents today for follow-up. ***  Hypertension BP *** - Continue current medications: Amlodipine  5mg  daily, Coreg  6.25mg  twice daily, Chlorthalidone  25mg  daily, Olmesartan  40mg  daily.   Of note, patient patient has Amlodipine  and Olmesartan  listed as allergies with both reactions listed as swelling. However, per review of prior Cardiology notes, swelling was not felt to be due to medications. I discussed this with patient at last office visit on 03/28/2023 and she also stated the swelling was not related  to the medications.    Hyperlipidemia Recent lipid panel on 03/16/2023 at PCPs office: Total Cholesterol 135, Triglycerides 168, HDL 41, LDL 65. *** - Continue Crestor 5mg  Monday/ Wednesday/ Friday. Unable to tolerate higher dose of this. - Continue Zetia  10mg  daily.  - Labs followed by PCP.   Type 2 Diabetes Mellitus Hemoglobin A1c *** - On Metformin. - Management per PCP.   History of Atypical Chest Pain Patient has a history of atypical chest pain. Lexiscan  Myoview  in 08/2020 was low risk with no evidence of ischemia. Chest pain was suspected to be musculoskeletal in nature.  - No recurrent chest pain.   Snoring *** Patient describes snoring, apneic episodes, and daytime fatigue. BMI 43.53. Suspect she has obstructive sleep apnea.  - Discussed Itmar home sleep study but patient is pretty clear that she does not want to wear a CPAP machine. Advised her to let us  know if she changes her mind and would like for us  to order a sleep study.  EKGs/Labs/Other Studies Reviewed:    The following studies were reviewed:   Myoview  08/20/2020: The left ventricular ejection fraction is hyperdynamic (>65%). Nuclear stress EF: 69%. T wave inversion was noted during stress in the II, III, aVR, aVF and V6 leads. Defect 1: There is a small defect of mild severity present in the mid anterior and mid anterolateral location. The study is normal. This is a low risk study.   Low risk study, no ischemia. There is a mild mid anterior/anterolateral defect at rest that improves with  stress. Based on counts/imaging, this is most likely artifact from tissue attenuation. No defects seen on stress imaging.  EKG:  EKG ordered today. EKG personally reviewed and demonstrates ***.  Recent Labs: No results found for requested labs within last 365 days.  Recent Lipid Panel    Component Value Date/Time   CHOL 124 07/16/2021 1149   TRIG 124 07/16/2021 1149   HDL 40 07/16/2021 1149   CHOLHDL 3.1 07/16/2021 1149    LDLCALC 62 07/16/2021 1149    Physical Exam:    Vital Signs: There were no vitals taken for this visit.    Wt Readings from Last 3 Encounters:  03/28/23 253 lb 9.6 oz (115 kg)  08/04/21 261 lb 9.6 oz (118.7 kg)  07/16/21 265 lb (120.2 kg)     General: 73 y.o. female in no acute distress. HEENT: Normocephalic and atraumatic. Sclera clear.  Neck: Supple. No carotid bruits. No JVD. Heart: *** RRR. Distinct S1 and S2. No murmurs, gallops, or rubs.  Lungs: No increased work of breathing. Clear to ausculation bilaterally. No wheezes, rhonchi, or rales.  Abdomen: Soft, non-distended, and non-tender to palpation.  Extremities: No lower extremity edema.  Radial and distal pedal pulses 2+ and equal bilaterally. Skin: Warm and dry. Neuro: No focal deficits. Psych: Normal affect. Responds appropriately.   Assessment:    No diagnosis found.  Plan:     Disposition: Follow up in ***   Signed, Alzada Brazee E Hatem Cull, PA-C  06/07/2024 4:31 PM    [facility]

## 2024-06-13 ENCOUNTER — Ambulatory Visit: Admitting: Student

## 2024-06-15 DIAGNOSIS — Z1211 Encounter for screening for malignant neoplasm of colon: Secondary | ICD-10-CM | POA: Diagnosis not present

## 2024-06-20 NOTE — Progress Notes (Deleted)
 Cardiology Office Note:    Date:  06/20/2024   ID:  Suzanne Patterson, DOB 05/21/1951, MRN [id number]  PCP:  Claudene Pellet, MD  Cardiologist:  Alm Clay, MD { Click to update primary MD,subspecialty MD or APP then REFRESH:1}    Referring MD: Claudene Pellet, MD   Chief Complaint: follow-up of hypertension  History of Present Illness:    Suzanne Patterson is a 73 y.o. female with a history of atypical chest pain with negative Myoview  in 08/2020, hypertension, hyperlipidemia intolerant to statins, type 2 diabetes mellitus, and GERD who is followed by Dr. Clay and presents today for routine follow-up.  Patient was referred to Dr. Clay in 07/2020 for further evaluation of chest pain. Initial plan was for a coronary CTA but patient's insurance would not cover this so Lexiscan  Myoview  was performed and was low risk with no evidence of ischemia. Chest pain was suspected to be musculoskeletal in nature. Since then, she has mainly been followed for hypertension. She has been seen multiple times in the PharmD Hypertension clinic.   She was last seen by me in 03/2023 at which time she was doing well from a cardiac standpoint. BP was well controlled in the office. However, she stated it was not uncommon for her BP to run in the 160s/ 90s at home but admitted to not checking her BP regularly. She was continue on her home medications and asked to keep a BP/HR log for 2 weeks.  Patient presents today for follow-up. ***  Hypertension BP *** - Continue current medications: Amlodipine  5mg  daily, Coreg  6.25mg  twice daily, Chlorthalidone  25mg  daily, Olmesartan  40mg  daily.   Of note, patient patient has Amlodipine  and Olmesartan  listed as allergies with both reactions listed as swelling. However, per review of prior Cardiology notes, swelling was not felt to be due to medications. I discussed this with patient at last office visit on 03/28/2023 and she also stated the swelling was not  related to the medications.    Hyperlipidemia Recent lipid panel on 03/16/2023 at PCPs office: Total Cholesterol 135, Triglycerides 168, HDL 41, LDL 65. *** - Continue Crestor 5mg  Monday/ Wednesday/ Friday. Unable to tolerate higher dose of this. - Continue Zetia  10mg  daily.  - Labs followed by PCP.   Type 2 Diabetes Mellitus Hemoglobin A1c *** - On Metformin. - Management per PCP.   History of Atypical Chest Pain Patient has a history of atypical chest pain. Lexiscan  Myoview  in 08/2020 was low risk with no evidence of ischemia. Chest pain was suspected to be musculoskeletal in nature.  - No recurrent chest pain.   Snoring *** Patient describes snoring, apneic episodes, and daytime fatigue. BMI 43.53. Suspect she has obstructive sleep apnea.  - Discussed Itmar home sleep study but patient is pretty clear that she does not want to wear a CPAP machine. Advised her to let us  know if she changes her mind and would like for us  to order a sleep study.  EKGs/Labs/Other Studies Reviewed:    The following studies were reviewed:   Myoview  08/20/2020: The left ventricular ejection fraction is hyperdynamic (>65%). Nuclear stress EF: 69%. T wave inversion was noted during stress in the II, III, aVR, aVF and V6 leads. Defect 1: There is a small defect of mild severity present in the mid anterior and mid anterolateral location. The study is normal. This is a low risk study.   Low risk study, no ischemia. There is a mild mid anterior/anterolateral defect at rest that improves with  stress. Based on counts/imaging, this is most likely artifact from tissue attenuation. No defects seen on stress imaging.  EKG:  EKG ordered today. EKG personally reviewed and demonstrates ***.  Recent Labs: No results found for requested labs within last 365 days.  Recent Lipid Panel    Component Value Date/Time   CHOL 124 07/16/2021 1149   TRIG 124 07/16/2021 1149   HDL 40 07/16/2021 1149   CHOLHDL 3.1  07/16/2021 1149   LDLCALC 62 07/16/2021 1149    Physical Exam:    Vital Signs: There were no vitals taken for this visit.    Wt Readings from Last 3 Encounters:  03/28/23 253 lb 9.6 oz (115 kg)  08/04/21 261 lb 9.6 oz (118.7 kg)  07/16/21 265 lb (120.2 kg)     General: 73 y.o. female in no acute distress. HEENT: Normocephalic and atraumatic. Sclera clear.  Neck: Supple. No carotid bruits. No JVD. Heart: *** RRR. Distinct S1 and S2. No murmurs, gallops, or rubs.  Lungs: No increased work of breathing. Clear to ausculation bilaterally. No wheezes, rhonchi, or rales.  Abdomen: Soft, non-distended, and non-tender to palpation.  Extremities: No lower extremity edema.  Radial and distal pedal pulses 2+ and equal bilaterally. Skin: Warm and dry. Neuro: No focal deficits. Psych: Normal affect. Responds appropriately.   Assessment:    No diagnosis found.  Plan:     Disposition: Follow up in ***   Signed, Aline FORBES Door, PA-C  06/20/2024 6:59 PM    [facility]

## 2024-07-03 ENCOUNTER — Ambulatory Visit: Admitting: Student

## 2024-07-27 DIAGNOSIS — S80869A Insect bite (nonvenomous), unspecified lower leg, initial encounter: Secondary | ICD-10-CM | POA: Diagnosis not present

## 2024-09-01 NOTE — Progress Notes (Deleted)
 Cardiology Office Note:    Date:  09/01/2024   ID:  Suzanne Patterson, Suzanne Patterson Jul 17, 1951, MRN [id number]  PCP:  Claudene Pellet, MD  Cardiologist:  Alm Clay, MD { Click to update primary MD,subspecialty MD or APP then REFRESH:1}    Referring MD: Claudene Pellet, MD   Chief Complaint: follow-up of hypertension  History of Present Illness:    Suzanne Patterson is a 73 y.o. female with a history of atypical chest pain with negative Myoview  in 08/2020, hypertension, hyperlipidemia intolerant to statins, type 2 diabetes mellitus, and GERD who is followed by Dr. Clay and presents today for routine follow-up.  Patient was referred to Dr. Clay in 07/2020 for further evaluation of chest pain. Initial plan was for a coronary CTA but patient's insurance would not cover this so Lexiscan  Myoview  was performed and was low risk with no evidence of ischemia. Chest pain was suspected to be musculoskeletal in nature. Since then, she has mainly been followed for hypertension. She has been seen multiple times in the [facility] Hypertension clinic.   She was last seen by me in 03/2023 at which time she was doing well from a cardiac standpoint. BP was well controlled in the office. However, she stated it was not uncommon for her BP to run in the 160s/ 90s at home but admitted to not checking her BP regularly. She was continue on her home medications and asked to keep a BP/HR log for 2 weeks.  Patient presents today for follow-up. ***  Hypertension BP *** - Continue current medications: Amlodipine  5mg  daily, Coreg  6.25mg  twice daily, Chlorthalidone  25mg  daily, Olmesartan  40mg  daily.   Of note, patient patient has Amlodipine  and Olmesartan  listed as allergies with both reactions listed as swelling. However, per review of prior Cardiology notes, swelling was not felt to be due to medications. I discussed this with patient at last office visit on 03/28/2023 and she also stated the swelling was not  related to the medications.    Hyperlipidemia Lipid panel in 03/2023 at PCPs office: Total Cholesterol 135, Triglycerides 168, HDL 41, LDL 65. *** - Continue Crestor 5mg  Monday/ Wednesday/ Friday. Unable to tolerate higher dose of this. - Continue Zetia  10mg  daily.  - Labs followed by PCP.   Type 2 Diabetes Mellitus Hemoglobin A1c *** - On Metformin. - Management per PCP.   History of Atypical Chest Pain Patient has a history of atypical chest pain. Lexiscan  Myoview  in 08/2020 was low risk with no evidence of ischemia. Chest pain was suspected to be musculoskeletal in nature.  - No recurrent chest pain.   Snoring *** Patient describes snoring, apneic episodes, and daytime fatigue. BMI 43.53. Suspect she has obstructive sleep apnea.  - Discussed Itmar home sleep study but patient is pretty clear that she does not want to wear a CPAP machine. Advised her to let us  know if she changes her mind and would like for us  to order a sleep study.    EKGs/Labs/Other Studies Reviewed:    The following studies were reviewed:   Myoview  08/20/2020: The left ventricular ejection fraction is hyperdynamic (>65%). Nuclear stress EF: 69%. T wave inversion was noted during stress in the II, III, aVR, aVF and V6 leads. Defect 1: There is a small defect of mild severity present in the mid anterior and mid anterolateral location. The study is normal. This is a low risk study.   Low risk study, no ischemia. There is a mild mid anterior/anterolateral defect at rest that improves  with stress. Based on counts/imaging, this is most likely artifact from tissue attenuation. No defects seen on stress imaging.   EKG:  EKG ordered today. EKG personally reviewed and demonstrates ***.  Recent Labs: No results found for requested labs within last 365 days.  Recent Lipid Panel    Component Value Date/Time   CHOL 124 07/16/2021 1149   TRIG 124 07/16/2021 1149   HDL 40 07/16/2021 1149   CHOLHDL 3.1 07/16/2021  1149   LDLCALC 62 07/16/2021 1149    Physical Exam:    Vital Signs: There were no vitals taken for this visit.    Wt Readings from Last 3 Encounters:  03/28/23 253 lb 9.6 oz (115 kg)  08/04/21 261 lb 9.6 oz (118.7 kg)  07/16/21 265 lb (120.2 kg)     General: 73 y.o. female in no acute distress. HEENT: Normocephalic and atraumatic. Sclera clear.  Neck: Supple. No carotid bruits. No JVD. Heart: *** RRR. Distinct S1 and S2. No murmurs, gallops, or rubs.  Lungs: No increased work of breathing. Clear to ausculation bilaterally. No wheezes, rhonchi, or rales.  Abdomen: Soft, non-distended, and non-tender to palpation.  Extremities: No lower extremity edema.  Radial and distal pedal pulses 2+ and equal bilaterally. Skin: Warm and dry. Neuro: No focal deficits. Psych: Normal affect. Responds appropriately.   Assessment:    No diagnosis found.  Plan:     Disposition: Follow up in ***   Signed, Aline FORBES Door, PA-C  09/01/2024 10:39 AM    [facility]

## 2024-09-05 DIAGNOSIS — L723 Sebaceous cyst: Secondary | ICD-10-CM | POA: Diagnosis not present

## 2024-09-11 ENCOUNTER — Ambulatory Visit: Admitting: Student

## 2024-09-12 DIAGNOSIS — L723 Sebaceous cyst: Secondary | ICD-10-CM | POA: Diagnosis not present

## 2024-09-25 NOTE — Progress Notes (Deleted)
 Cardiology Office Note:    Date:  09/25/2024   ID:  Suzanne Patterson, [name] 16-Nov-1951, MRN [id number]  PCP:  Claudene Pellet, MD  Cardiologist:  Alm Clay, MD { Click to update primary MD,subspecialty MD or APP then REFRESH:1}    Referring MD: Claudene Pellet, MD   Chief Complaint: follow-up of hypertension  History of Present Illness:    Suzanne Patterson is a 73 y.o. female with a history of atypical chest pain with negative Myoview  in 08/2020, hypertension, hyperlipidemia intolerant to statins, type 2 diabetes mellitus, and GERD who is followed by Dr. Clay and presents today for routine follow-up.  Patient was referred to Dr. Clay in 07/2020 for further evaluation of chest pain. Initial plan was for a coronary CTA but patient's insurance would not cover this so Lexiscan  Myoview  was performed and was low risk with no evidence of ischemia. Chest pain was suspected to be musculoskeletal in nature. Since then, she has mainly been followed for hypertension. She has been seen multiple times in the PharmD Hypertension clinic.   She was last seen by me in 03/2023 at which time she was doing well from a cardiac standpoint. BP was well controlled in the office. However, she stated it was not uncommon for her BP to run in the 160s/ 90s at home but admitted to not checking her BP regularly. She was continue on her home medications and asked to keep a BP/HR log for 2 weeks.  Patient presents today for follow-up. ***  Hypertension BP *** - Continue current medications: Amlodipine  5mg  daily, Coreg  6.25mg  twice daily, Chlorthalidone  25mg  daily, Olmesartan  40mg  daily.   Of note, patient patient has Amlodipine  and Olmesartan  listed as allergies with both reactions listed as swelling. However, per review of prior Cardiology notes, swelling was not felt to be due to medications. I discussed this with patient at last office visit on 03/28/2023 and she also stated the swelling was not  related to the medications.    Hyperlipidemia Lipid panel in 03/2023 at PCPs office: Total Cholesterol 135, Triglycerides 168, HDL 41, LDL 65. *** - Continue Crestor 5mg  Monday/ Wednesday/ Friday. Unable to tolerate higher dose of this. - Continue Zetia  10mg  daily.  - Labs followed by PCP.   Type 2 Diabetes Mellitus Hemoglobin A1c *** - On Metformin. - Management per PCP.   History of Atypical Chest Pain Patient has a history of atypical chest pain. Lexiscan  Myoview  in 08/2020 was low risk with no evidence of ischemia. Chest pain was suspected to be musculoskeletal in nature.  - No recurrent chest pain.   Snoring *** Patient describes snoring, apneic episodes, and daytime fatigue. BMI 43.53. Suspect she has obstructive sleep apnea.  - Discussed Itmar home sleep study but patient is pretty clear that she does not want to wear a CPAP machine. Advised her to let us  know if she changes her mind and would like for us  to order a sleep study.    EKGs/Labs/Other Studies Reviewed:    The following studies were reviewed:   Myoview  08/20/2020: The left ventricular ejection fraction is hyperdynamic (>65%). Nuclear stress EF: 69%. T wave inversion was noted during stress in the II, III, aVR, aVF and V6 leads. Defect 1: There is a small defect of mild severity present in the mid anterior and mid anterolateral location. The study is normal. This is a low risk study.   Low risk study, no ischemia. There is a mild mid anterior/anterolateral defect at rest that improves  with stress. Based on counts/imaging, this is most likely artifact from tissue attenuation. No defects seen on stress imaging.   EKG:  EKG ordered today. EKG personally reviewed and demonstrates ***.  Recent Labs: No results found for requested labs within last 365 days.  Recent Lipid Panel    Component Value Date/Time   CHOL 124 07/16/2021 1149   TRIG 124 07/16/2021 1149   HDL 40 07/16/2021 1149   CHOLHDL 3.1 07/16/2021  1149   LDLCALC 62 07/16/2021 1149    Physical Exam:    Vital Signs: There were no vitals taken for this visit.    Wt Readings from Last 3 Encounters:  03/28/23 253 lb 9.6 oz (115 kg)  08/04/21 261 lb 9.6 oz (118.7 kg)  07/16/21 265 lb (120.2 kg)     General: 73 y.o. female in no acute distress. HEENT: Normocephalic and atraumatic. Sclera clear.  Neck: Supple. No carotid bruits. No JVD. Heart: *** RRR. Distinct S1 and S2. No murmurs, gallops, or rubs.  Lungs: No increased work of breathing. Clear to ausculation bilaterally. No wheezes, rhonchi, or rales.  Abdomen: Soft, non-distended, and non-tender to palpation.  Extremities: No lower extremity edema.  Radial and distal pedal pulses 2+ and equal bilaterally. Skin: Warm and dry. Neuro: No focal deficits. Psych: Normal affect. Responds appropriately.   Assessment:    No diagnosis found.  Plan:     Disposition: Follow up in ***   Signed, Aline FORBES Door, PA-C  09/25/2024 9:33 AM    [facility]

## 2024-10-02 ENCOUNTER — Ambulatory Visit: Admitting: Student

## 2024-10-17 NOTE — Progress Notes (Signed)
 Suzanne Patterson                                          MRN: [id number]   10/17/2024   The VBCI Quality Team Specialist reviewed this patient medical record for the purposes of chart review for care gap closure. The following were reviewed: chart review for care gap closure-glycemic status assessment.    VBCI Quality Team

## 2024-10-30 NOTE — Progress Notes (Deleted)
 Cardiology Office Note:    Date:  10/30/2024   ID:  Suzanne Patterson, Suzanne Patterson 24, 1952, MRN [id number]  PCP:  Suzanne Pellet, MD  Cardiologist:  Alm Clay, MD   Referring MD: Suzanne Pellet, MD   Chief Complaint: follow-up of hypertension  History of Present Illness:    Suzanne Patterson is a 73 y.o. female with a history of atypical chest pain with negative Myoview  in 08/2020, hypertension, hyperlipidemia intolerant to statins, type 2 diabetes mellitus, and GERD who is followed by Dr. Clay and presents today for routine follow-up.  Patient was referred to Dr. Clay in 07/2020 for further evaluation of chest pain. Initial plan was for a coronary CTA but patient's insurance would not cover this so Lexiscan  Myoview  was performed and was low risk with no evidence of ischemia. Chest pain was suspected to be musculoskeletal in nature. Since then, she has mainly been followed for hypertension. She has been seen multiple times in the PharmD Hypertension clinic.   She was last seen by me in 03/2023 at which time she was doing well from a cardiac standpoint. BP was well controlled in the office. However, she stated it was not uncommon for her BP to run in the 160s/ 90s at home but admitted to not checking her BP regularly. She was continue on her home medications and asked to keep a BP/HR log for 2 weeks.  Patient presents today for follow-up. ***  Hypertension BP *** - Continue current medications: Amlodipine  5mg  daily, Coreg  6.25mg  twice daily, Chlorthalidone  25mg  daily, Olmesartan  40mg  daily.   Of note, patient patient has Amlodipine  and Olmesartan  listed as allergies with both reactions listed as swelling. However, per review of prior Cardiology notes, swelling was not felt to be due to medications. I discussed this with patient at last office visit on 03/28/2023 and she also stated the swelling was not related to the medications.    Hyperlipidemia Lipid panel in 03/2023 at  PCPs office: Total Cholesterol 135, Triglycerides 168, HDL 41, LDL 65. *** - Continue Crestor 5mg  Monday/ Wednesday/ Friday. Unable to tolerate higher dose of this. - Continue Zetia  10mg  daily.  - Labs followed by PCP.   Type 2 Diabetes Mellitus Hemoglobin A1c *** - On Metformin. - Management per PCP.   History of Atypical Chest Pain Patient has a history of atypical chest pain. Lexiscan  Myoview  in 08/2020 was low risk with no evidence of ischemia. Chest pain was suspected to be musculoskeletal in nature.  - No recurrent chest pain.   Snoring *** Patient describes snoring, apneic episodes, and daytime fatigue. BMI 43.53. Suspect she has obstructive sleep apnea.  - Discussed Itmar home sleep study but patient is pretty clear that she does not want to wear a CPAP machine. Advised her to let us  know if she changes her mind and would like for us  to order a sleep study.    EKGs/Labs/Other Studies Reviewed:    The following studies were reviewed:   Myoview  08/20/2020: The left ventricular ejection fraction is hyperdynamic (>65%). Nuclear stress EF: 69%. T wave inversion was noted during stress in the II, III, aVR, aVF and V6 leads. Defect 1: There is a small defect of mild severity present in the mid anterior and mid anterolateral location. The study is normal. This is a low risk study.   Low risk study, no ischemia. There is a mild mid anterior/anterolateral defect at rest that improves with stress. Based on counts/imaging, this is most likely artifact from tissue  attenuation. No defects seen on stress imaging.   EKG:  EKG ordered today. EKG personally reviewed and demonstrates ***.  Recent Labs: No results found for requested labs within last 365 days.  Recent Lipid Panel    Component Value Date/Time   CHOL 124 07/16/2021 1149   TRIG 124 07/16/2021 1149   HDL 40 07/16/2021 1149   CHOLHDL 3.1 07/16/2021 1149   LDLCALC 62 07/16/2021 1149    Physical Exam:    Vital Signs:  There were no vitals taken for this visit.    Wt Readings from Last 3 Encounters:  03/28/23 253 lb 9.6 oz (115 kg)  08/04/21 261 lb 9.6 oz (118.7 kg)  07/16/21 265 lb (120.2 kg)     General: 73 y.o. female in no acute distress. HEENT: Normocephalic and atraumatic. Sclera clear.  Neck: Supple. No carotid bruits. No JVD. Heart: *** RRR. Distinct S1 and S2. No murmurs, gallops, or rubs.  Lungs: No increased work of breathing. Clear to ausculation bilaterally. No wheezes, rhonchi, or rales.  Abdomen: Soft, non-distended, and non-tender to palpation.  Extremities: No lower extremity edema.  Radial and distal pedal pulses 2+ and equal bilaterally. Skin: Warm and dry. Neuro: No focal deficits. Psych: Normal affect. Responds appropriately.   Assessment:    No diagnosis found.  Plan:     Disposition: Follow up in ***   Signed, Suzanne LOISE Fabry, PA-C  10/30/2024 6:36 AM    [facility]

## 2024-10-31 ENCOUNTER — Ambulatory Visit: Admitting: Physician Assistant

## 2024-11-09 NOTE — Progress Notes (Deleted)
 Cardiology Office Note:    Date:  11/09/2024   ID:  Suzanne Patterson, Suzanne Patterson 13-Aug-1951, MRN [id number]  PCP:  Claudene Pellet, MD  Cardiologist:  Alm Clay, MD   Referring MD: Claudene Pellet, MD   Chief Complaint: follow-up of hypertension  History of Present Illness:    Suzanne Patterson is a 73 y.o. female with a history of atypical chest pain with negative Myoview  in 08/2020, hypertension, hyperlipidemia intolerant to statins, type 2 diabetes mellitus, and GERD who is followed by Dr. Clay and presents today for routine follow-up.  Patient was referred to Dr. Clay in 07/2020 for further evaluation of chest pain. Initial plan was for a coronary CTA but patient's insurance would not cover this so Lexiscan  Myoview  was performed and was low risk with no evidence of ischemia. Chest pain was suspected to be musculoskeletal in nature. Since then, she has mainly been followed for hypertension. She has been seen multiple times in the PharmD Hypertension clinic.   She was last seen by me in 03/2023 at which time she was doing well from a cardiac standpoint. BP was well controlled in the office. However, she stated it was not uncommon for her BP to run in the 160s/ 90s at home but admitted to not checking her BP regularly. She was continue on her home medications and asked to keep a BP/HR log for 2 weeks.  Patient presents today for follow-up. ***  Hypertension BP *** - Continue current medications: Amlodipine  5mg  daily, Coreg  6.25mg  twice daily, Chlorthalidone  25mg  daily, Olmesartan  40mg  daily.   Of note, patient patient has Amlodipine  and Olmesartan  listed as allergies with both reactions listed as swelling. However, per review of prior Cardiology notes, swelling was not felt to be due to medications. I discussed this with patient at last office visit on 03/28/2023 and she also stated the swelling was not related to the medications.    Hyperlipidemia Lipid panel in 03/2023 at  PCPs office: Total Cholesterol 135, Triglycerides 168, HDL 41, LDL 65. *** - Continue Crestor 5mg  Monday/ Wednesday/ Friday. Unable to tolerate higher dose of this. - Continue Zetia  10mg  daily.  - Labs followed by PCP.   Type 2 Diabetes Mellitus Hemoglobin A1c *** - On Metformin. - Management per PCP.   History of Atypical Chest Pain Patient has a history of atypical chest pain. Lexiscan  Myoview  in 08/2020 was low risk with no evidence of ischemia. Chest pain was suspected to be musculoskeletal in nature.  - No recurrent chest pain.   Snoring *** Patient describes snoring, apneic episodes, and daytime fatigue. BMI 43.53. Suspect she has obstructive sleep apnea.  - Discussed Itmar home sleep study but patient is pretty clear that she does not want to wear a CPAP machine. Advised her to let us  know if she changes her mind and would like for us  to order a sleep study.    EKGs/Labs/Other Studies Reviewed:    The following studies were reviewed:   Myoview  08/20/2020: The left ventricular ejection fraction is hyperdynamic (>65%). Nuclear stress EF: 69%. T wave inversion was noted during stress in the II, III, aVR, aVF and V6 leads. Defect 1: There is a small defect of mild severity present in the mid anterior and mid anterolateral location. The study is normal. This is a low risk study.   Low risk study, no ischemia. There is a mild mid anterior/anterolateral defect at rest that improves with stress. Based on counts/imaging, this is most likely artifact from tissue  attenuation. No defects seen on stress imaging.   EKG:  EKG ordered today. EKG personally reviewed and demonstrates ***.  Recent Labs: No results found for requested labs within last 365 days.  Recent Lipid Panel    Component Value Date/Time   CHOL 124 07/16/2021 1149   TRIG 124 07/16/2021 1149   HDL 40 07/16/2021 1149   CHOLHDL 3.1 07/16/2021 1149   LDLCALC 62 07/16/2021 1149    Physical Exam:    Vital Signs:  There were no vitals taken for this visit.    Wt Readings from Last 3 Encounters:  03/28/23 253 lb 9.6 oz (115 kg)  08/04/21 261 lb 9.6 oz (118.7 kg)  07/16/21 265 lb (120.2 kg)     General: 73 y.o. female in no acute distress. HEENT: Normocephalic and atraumatic. Sclera clear.  Neck: Supple. No carotid bruits. No JVD. Heart: *** RRR. Distinct S1 and S2. No murmurs, gallops, or rubs.  Lungs: No increased work of breathing. Clear to ausculation bilaterally. No wheezes, rhonchi, or rales.  Abdomen: Soft, non-distended, and non-tender to palpation.  Extremities: No lower extremity edema.  Radial and distal pedal pulses 2+ and equal bilaterally. Skin: Warm and dry. Neuro: No focal deficits. Psych: Normal affect. Responds appropriately.   Assessment:    No diagnosis found.  Plan:     Disposition: Follow up in ***   Signed, Suzanne LOISE Fabry, PA-C  11/09/2024 12:26 PM    [facility]

## 2024-11-13 ENCOUNTER — Ambulatory Visit: Admitting: Physician Assistant

## 2024-11-13 DIAGNOSIS — E1169 Type 2 diabetes mellitus with other specified complication: Secondary | ICD-10-CM

## 2024-11-13 DIAGNOSIS — I10 Essential (primary) hypertension: Secondary | ICD-10-CM

## 2024-11-13 DIAGNOSIS — R0789 Other chest pain: Secondary | ICD-10-CM

## 2024-11-13 DIAGNOSIS — E785 Hyperlipidemia, unspecified: Secondary | ICD-10-CM

## 2024-11-13 DIAGNOSIS — R0683 Snoring: Secondary | ICD-10-CM

## 2024-11-13 DIAGNOSIS — R079 Chest pain, unspecified: Secondary | ICD-10-CM

## 2024-12-03 ENCOUNTER — Telehealth: Payer: Self-pay | Admitting: Cardiology

## 2024-12-03 MED ORDER — OLMESARTAN MEDOXOMIL 40 MG PO TABS: 40.0000 mg | ORAL_TABLET | Freq: Every day | ORAL | 0 refills | Status: AC

## 2024-12-03 MED ORDER — EZETIMIBE 10 MG PO TABS: 10.0000 mg | ORAL_TABLET | Freq: Every day | ORAL | 0 refills | Status: AC

## 2024-12-03 MED ORDER — CARVEDILOL 6.25 MG PO TABS: 6.2500 mg | ORAL_TABLET | Freq: Two times a day (BID) | ORAL | 0 refills | Status: AC

## 2024-12-03 MED ORDER — AMLODIPINE BESYLATE 5 MG PO TABS: 5.0000 mg | ORAL_TABLET | Freq: Every day | ORAL | 0 refills | Status: AC

## 2024-12-03 MED ORDER — CHLORTHALIDONE 25 MG PO TABS: 25.0000 mg | ORAL_TABLET | Freq: Every day | ORAL | 0 refills | Status: AC

## 2024-12-03 NOTE — Telephone Encounter (Signed)
 Pt c/o medication issue:  1. Name of Medication: All medications prescribed by [facility]  2. How are you currently taking this medication (dosage and times per day)? As Written  3. Are you having a reaction (difficulty breathing--STAT)? No 4. What is your medication issue? Patient and Patient's husband called stating they will no longer have insurance after 12/05/24. Patient would like to know if their medications are available for refill before 12/05/24 due to not having coverage. Please advise.

## 2024-12-03 NOTE — Telephone Encounter (Signed)
 Spoke with patient and her spouse. They state there was an error in filing to renew their insurance and because of this they will have to wait an undetermined amount of time for their insurance to be renewed in 2026.  Patient is asking if she can get her cardiac medications refilled prior to her insurance ending on 12/05/24.   Patient is overdue for follow-up. Scheduled appt with APP for 02/01/25 at 9:40 AM. Informed patient we can send in enough refills now to cover her until this appt, and that she must keep the appt for future refills. Patient verbalized understanding.

## 2024-12-28 ENCOUNTER — Ambulatory Visit: Admitting: Physician Assistant

## 2025-01-04 ENCOUNTER — Encounter: Payer: Self-pay | Admitting: *Deleted

## 2025-01-04 NOTE — Progress Notes (Signed)
 Suzanne Patterson                                          MRN: [id number]   01/04/2025   The VBCI Quality Team Specialist reviewed this patient medical record for the purposes of chart review for care gap closure. The following were reviewed: chart review for care gap closure-glycemic status assessment.    VBCI Quality Team

## 2025-02-01 ENCOUNTER — Ambulatory Visit: Admitting: General Practice
